# Patient Record
Sex: Male | Born: 1975
Health system: Southern US, Community
[De-identification: ages and names within clinical notes are randomized; demographics above are authoritative.]

## PROBLEM LIST (undated history)

## (undated) DIAGNOSIS — I10 Essential (primary) hypertension: Secondary | ICD-10-CM

## (undated) DIAGNOSIS — N529 Male erectile dysfunction, unspecified: Secondary | ICD-10-CM

## (undated) DIAGNOSIS — K219 Gastro-esophageal reflux disease without esophagitis: Secondary | ICD-10-CM

## (undated) DIAGNOSIS — E785 Hyperlipidemia, unspecified: Secondary | ICD-10-CM

## (undated) HISTORY — DX: Hyperlipidemia, unspecified: E78.5

## (undated) HISTORY — DX: Gastro-esophageal reflux disease without esophagitis: K21.9

## (undated) HISTORY — DX: Male erectile dysfunction, unspecified: N52.9

## (undated) HISTORY — DX: Essential (primary) hypertension: I10

---

## 2010-02-26 ENCOUNTER — Emergency Department: Payer: Self-pay | Admitting: Emergency Medicine

## 2010-03-01 ENCOUNTER — Emergency Department: Payer: Self-pay | Admitting: Emergency Medicine

## 2010-03-05 ENCOUNTER — Emergency Department: Payer: Self-pay | Admitting: Unknown Physician Specialty

## 2010-03-13 ENCOUNTER — Emergency Department: Payer: Self-pay | Admitting: Emergency Medicine

## 2010-03-27 ENCOUNTER — Emergency Department: Payer: Self-pay | Admitting: Emergency Medicine

## 2010-04-17 ENCOUNTER — Ambulatory Visit: Payer: Self-pay | Admitting: Internal Medicine

## 2010-04-23 ENCOUNTER — Emergency Department: Payer: Self-pay | Admitting: Emergency Medicine

## 2010-04-29 ENCOUNTER — Ambulatory Visit: Payer: Self-pay | Admitting: Surgery

## 2010-05-02 ENCOUNTER — Emergency Department: Payer: Self-pay | Admitting: Emergency Medicine

## 2010-05-05 ENCOUNTER — Ambulatory Visit: Payer: Self-pay | Admitting: Family Medicine

## 2010-05-19 ENCOUNTER — Ambulatory Visit: Payer: Self-pay | Admitting: Gastroenterology

## 2010-05-26 ENCOUNTER — Ambulatory Visit: Payer: Self-pay | Admitting: Surgery

## 2010-06-03 ENCOUNTER — Ambulatory Visit: Payer: Self-pay | Admitting: Specialist

## 2010-07-25 HISTORY — PX: GALLBLADDER SURGERY: SHX652

## 2010-07-25 HISTORY — PX: UPPER GASTROINTESTINAL ENDOSCOPY: SHX188

## 2010-07-25 HISTORY — PX: COLONOSCOPY: SHX174

## 2010-08-04 ENCOUNTER — Ambulatory Visit: Payer: Self-pay | Admitting: Family Medicine

## 2010-10-22 ENCOUNTER — Ambulatory Visit: Payer: Self-pay | Admitting: Neurosurgery

## 2010-11-02 ENCOUNTER — Ambulatory Visit: Payer: Self-pay | Admitting: Gastroenterology

## 2010-11-04 LAB — PATHOLOGY REPORT

## 2010-12-03 ENCOUNTER — Ambulatory Visit: Payer: Self-pay | Admitting: Surgery

## 2010-12-06 LAB — PATHOLOGY REPORT

## 2011-01-04 ENCOUNTER — Ambulatory Visit: Payer: Self-pay | Admitting: Cardiovascular Disease

## 2011-01-20 ENCOUNTER — Encounter: Payer: Self-pay | Admitting: Cardiovascular Disease

## 2011-01-20 ENCOUNTER — Ambulatory Visit (INDEPENDENT_AMBULATORY_CARE_PROVIDER_SITE_OTHER): Payer: No Typology Code available for payment source | Admitting: Cardiovascular Disease

## 2011-01-20 DIAGNOSIS — E785 Hyperlipidemia, unspecified: Secondary | ICD-10-CM | POA: Insufficient documentation

## 2011-01-20 DIAGNOSIS — R0602 Shortness of breath: Secondary | ICD-10-CM

## 2011-01-20 DIAGNOSIS — I10 Essential (primary) hypertension: Secondary | ICD-10-CM

## 2011-01-20 NOTE — Patient Instructions (Signed)
You are doing well. No medication changes were made.  Please call us if you have new issues that need to be addressed before your next appt.    

## 2011-01-20 NOTE — Assessment & Plan Note (Signed)
He is tolerating his cholesterol medication well. I encouraged him to stay on this. We'll try to obtain his most recent lipid panel from Dr. Suzie Portela.

## 2011-01-20 NOTE — Assessment & Plan Note (Signed)
Risperdal on time discussing his blood pressure with him. In general he has relatively well-controlled blood pressure. The nurses today will check his blood pressure with a manual cuff to ensure that it is consistent. I am concerned more with periods of hypotension and dizziness. He does have record of his blood pressure and her numerous blood pressures with systolic pressures less than 110. I suggested that if he continues to have periods of dizziness, that he cut his medication in half. He could even take half a pill in the morning and half at nighttime. As he works in [employer], it'll be important for him to stay hydrated.

## 2011-01-20 NOTE — Progress Notes (Signed)
   Patient ID: Dustin Perkins, male    DOB: 1975-09-15, 35 y.o.   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  Overall he has been feeling well. He does have very occasional shortness of breath though in general is able to work a full day without any symptoms. He's concerned about his blood pressure is sometimes his systolic pressures are elevated in the 140s. Occasional diastolic pressure in the 90s. He does have occasional dizziness at work particularly if he stands up too quickly. He was previously well managed on Benicar 20 mg daily this was increased to 40 mg for hypertension when systolic pressures were in the 140-150 range.  Previous echocardiogram November of 2011 was essentially normal Previous EKGs have been essentially normal  EKG today shows normal sinus rhythm with rate 82 beats per minute with no significant ST or T wave changes No lipid panel is available for [place] today     Review of Systems  Constitutional: Negative.   HENT: Negative.   Eyes: Negative.   Respiratory: Positive for shortness of breath.   Cardiovascular: Negative.   Gastrointestinal: Negative.   Musculoskeletal: Negative.   Skin: Negative.   Neurological: Negative.   Hematological: Negative.   Psychiatric/Behavioral: Negative.   All other systems reviewed and are negative.    BP 141/93  Pulse 82  Ht 5\' 11"  (1.803 m)  Wt 204 lb (92.534 kg)  BMI 28.45 kg/m2  Physical Exam  Nursing note and vitals reviewed. Constitutional: He is oriented to person, place, and time. He appears well-developed and well-nourished.  HENT:  Head: Normocephalic.  Nose: Nose normal.  Mouth/Throat: Oropharynx is clear and moist.  Eyes: Conjunctivae are normal. Pupils are equal, round, and  reactive to light.  Neck: Normal range of motion. Neck supple. No JVD present.  Cardiovascular: Normal rate, regular rhythm, S1 normal, S2 normal, normal heart sounds and intact distal pulses.  Exam reveals no gallop and no friction rub.   No murmur heard. Pulmonary/Chest: Effort normal and breath sounds normal. No respiratory distress. He has no wheezes. He has no rales. He exhibits no tenderness.  Abdominal: Soft. Bowel sounds are normal. He exhibits no distension. There is no tenderness.  Musculoskeletal: Normal range of motion. He exhibits no edema and no tenderness.  Lymphadenopathy:    He has no cervical adenopathy.  Neurological: He is alert and oriented to person, place, and time. Coordination normal.  Skin: Skin is warm and dry. No rash noted. No erythema.  Psychiatric: He has a normal mood and affect. His behavior is normal. Judgment and thought content normal.           Assessment and Plan

## 2011-01-20 NOTE — Progress Notes (Signed)
Addended by: Phoebe Sharps on: 01/20/2011 07:44 PM   Modules accepted: Level of Service

## 2011-01-20 NOTE — Assessment & Plan Note (Signed)
His shortness of breath is very atypical. 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 Able to work around the garden but no symptoms. Symptoms of shortness of breath, and rarely. Normal recent echocardiogram. Normal EKG. No further workup needed.

## 2011-01-28 ENCOUNTER — Encounter: Payer: Self-pay | Admitting: Cardiovascular Disease

## 2011-05-16 ENCOUNTER — Ambulatory Visit: Payer: Self-pay | Admitting: Gastroenterology

## 2011-09-08 IMAGING — CR DG CHEST 2V
1 series · 2 of 2 positions shown · non-contrast
Comparison: none

REASON FOR EXAM: chest pain, fatigue
COMMENTS:

[Series 1: view not recorded · 0.17mm/px · 2 of 2 slices shown]
[im 1/2]
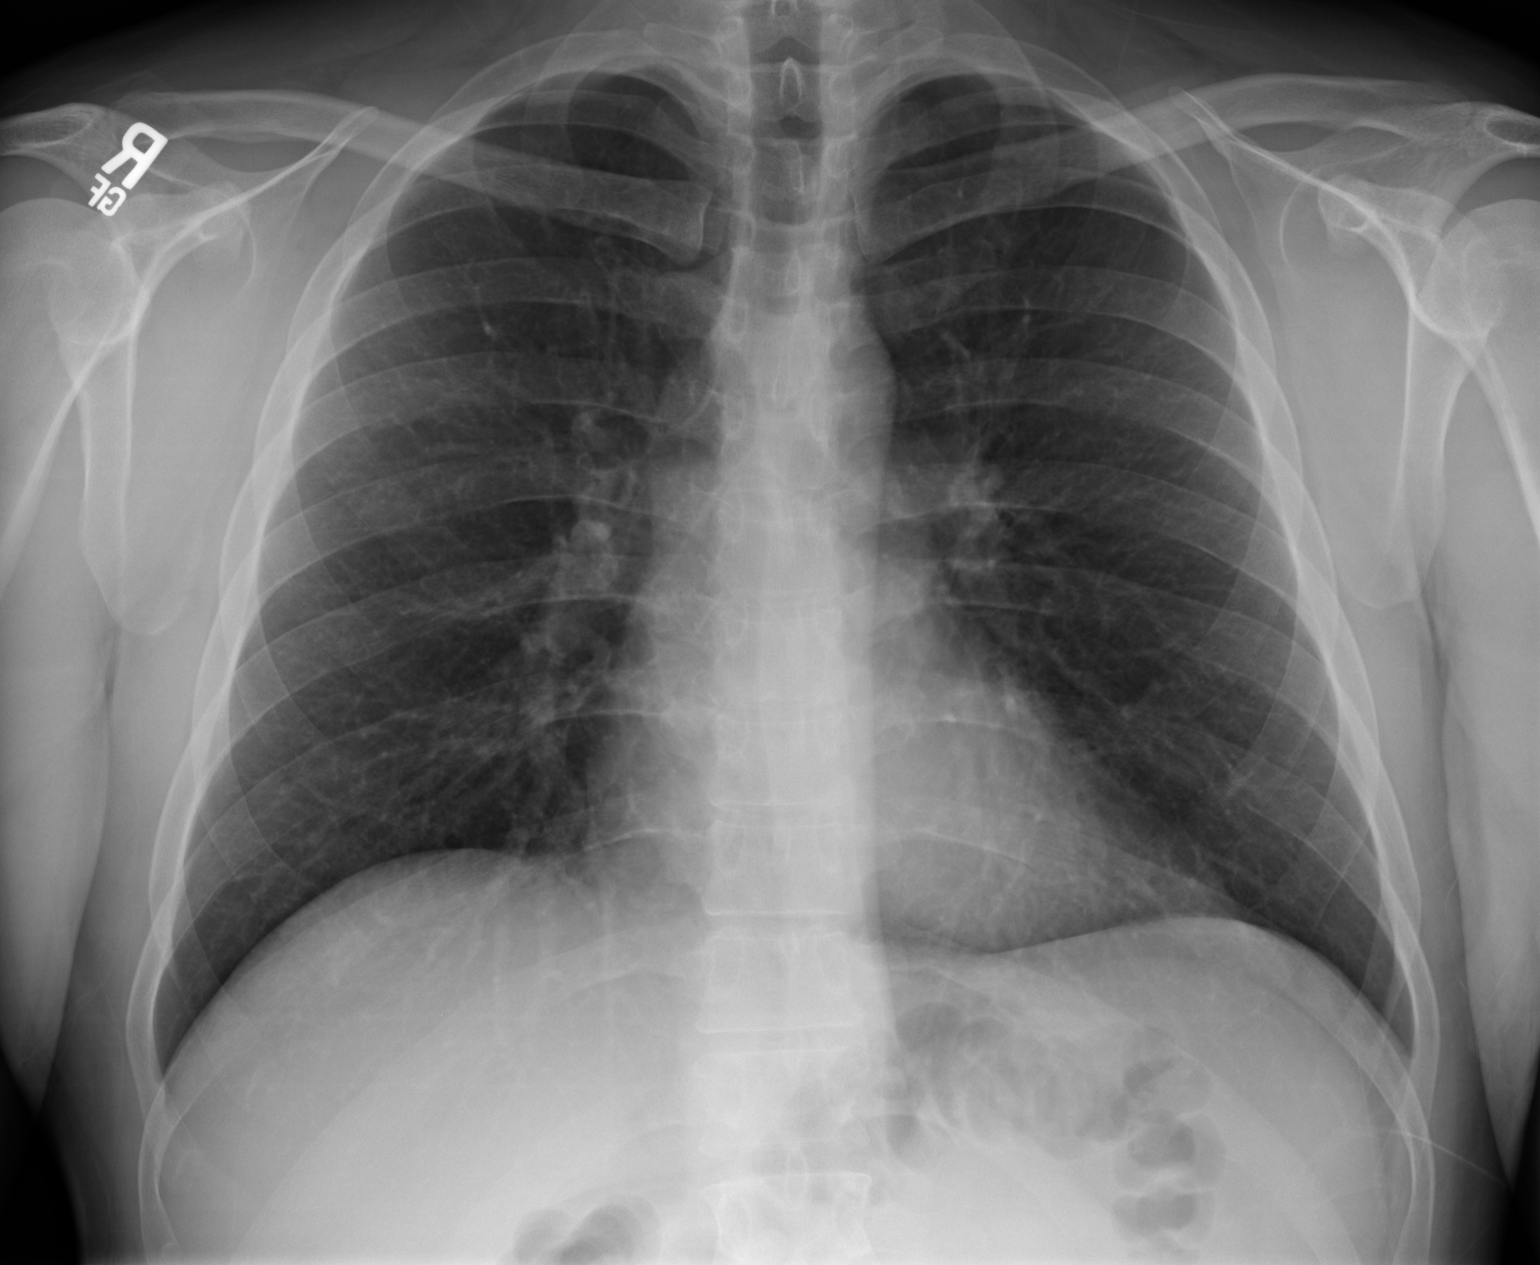
[im 2/2]
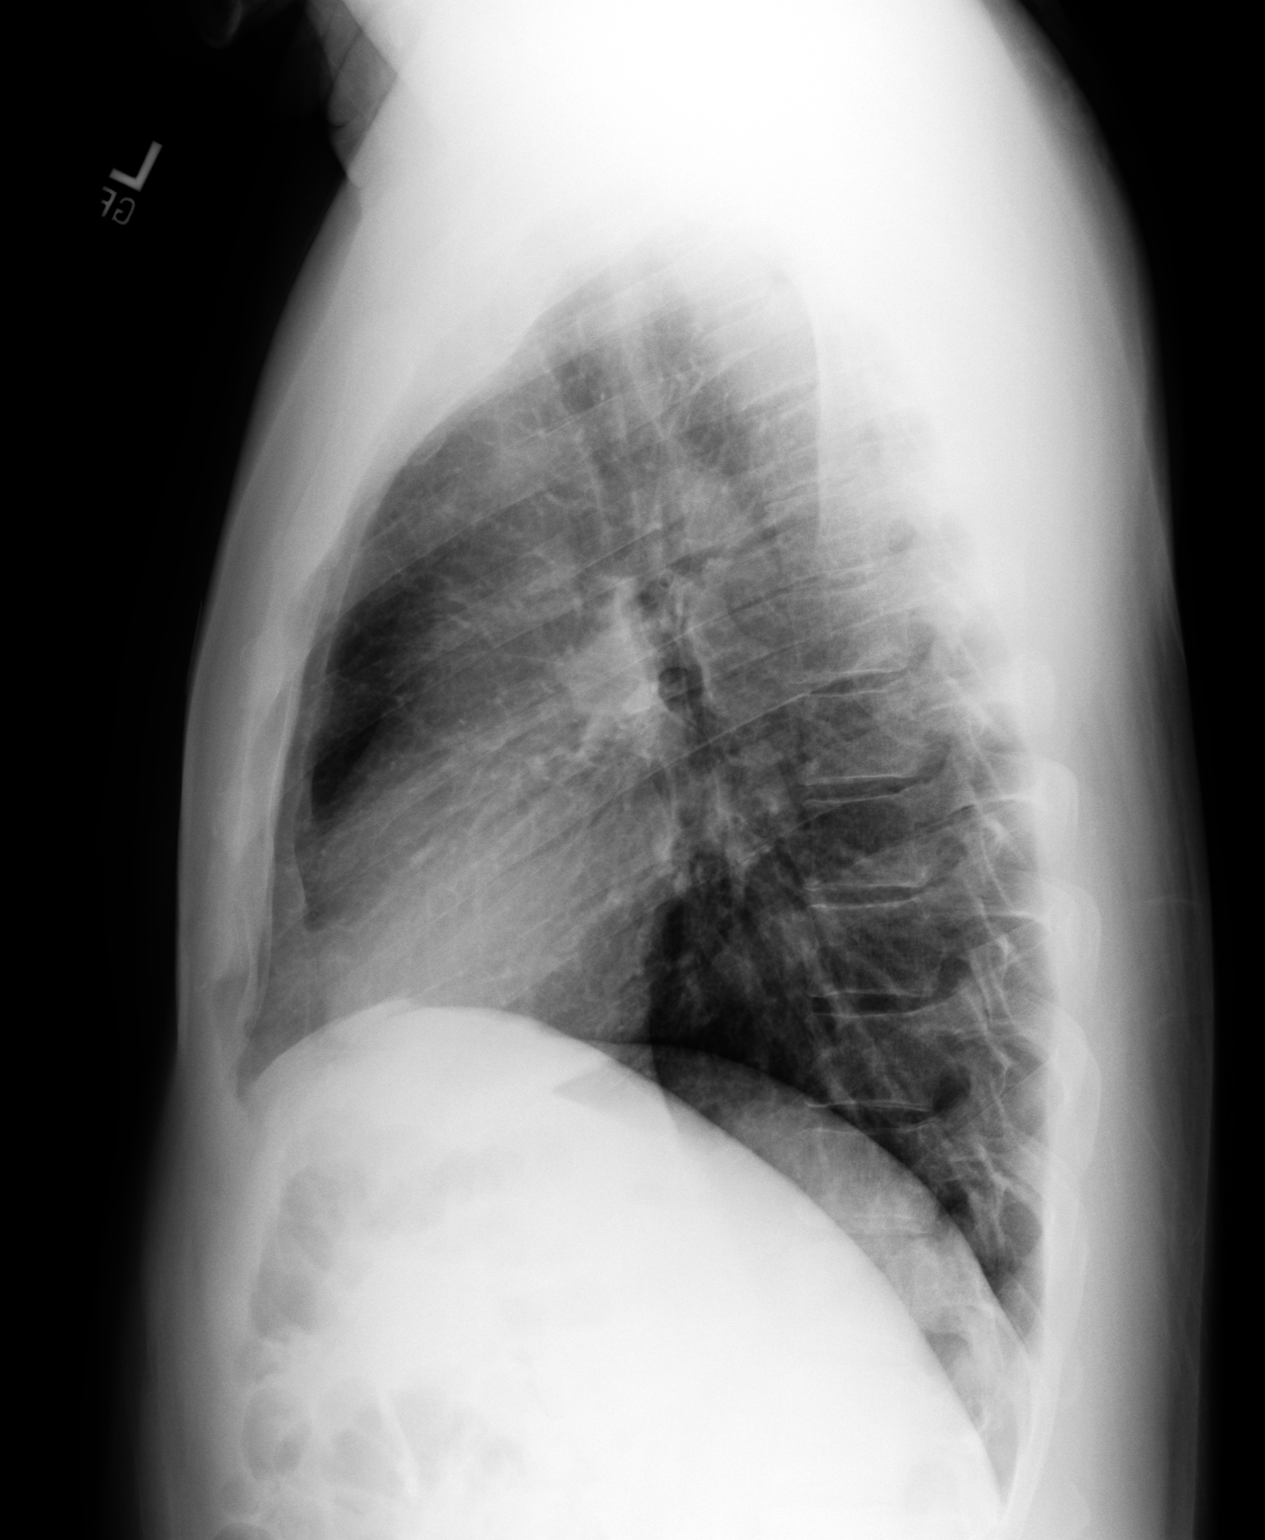

[2 of 2 positions shown; findings below may reference images not displayed]

PROCEDURE:     DXR - DXR CHEST PA (OR AP) AND LATERAL  - May 02, 2010  [DATE]

RESULT:     PA and lateral chest films reveal the lungs to be adequately
inflated. The interstitial markings are minimally increased. The cardiac
silhouette is normal in size. I see no pleural effusion. The mediastinum is
not widened. The bony thorax is grossly normal.
IMPRESSION: There are mildly increased interstitial markings in both
lungs. This may reflect mild interstitial edema of cardiac or noncardiac
cause. Followup films following therapy or in 2 to 3 weeks are recommended
to assure clearing.

## 2011-09-11 IMAGING — CR ORBITS FOR FOREIGN BODY - 2 VIEW
1 series · 3 of 3 positions shown · non-contrast
Comparison: none

REASON FOR EXAM: Hx of metal in eye. Eval for residual fragments prior to
MRI
COMMENTS:

PROCEDURE:     DXR - DXR ORBITS FOR MRI CLEARANCE  - May 05, 2010  [DATE]
RESULT:     Three views of the orbits are submitted. I do not see evidence
of retained metallic foreign bodies. The bony structures are grossly normal.

[Series 1: view not recorded · 0.17mm/px · 3 of 3 slices shown]
[im 1/3]
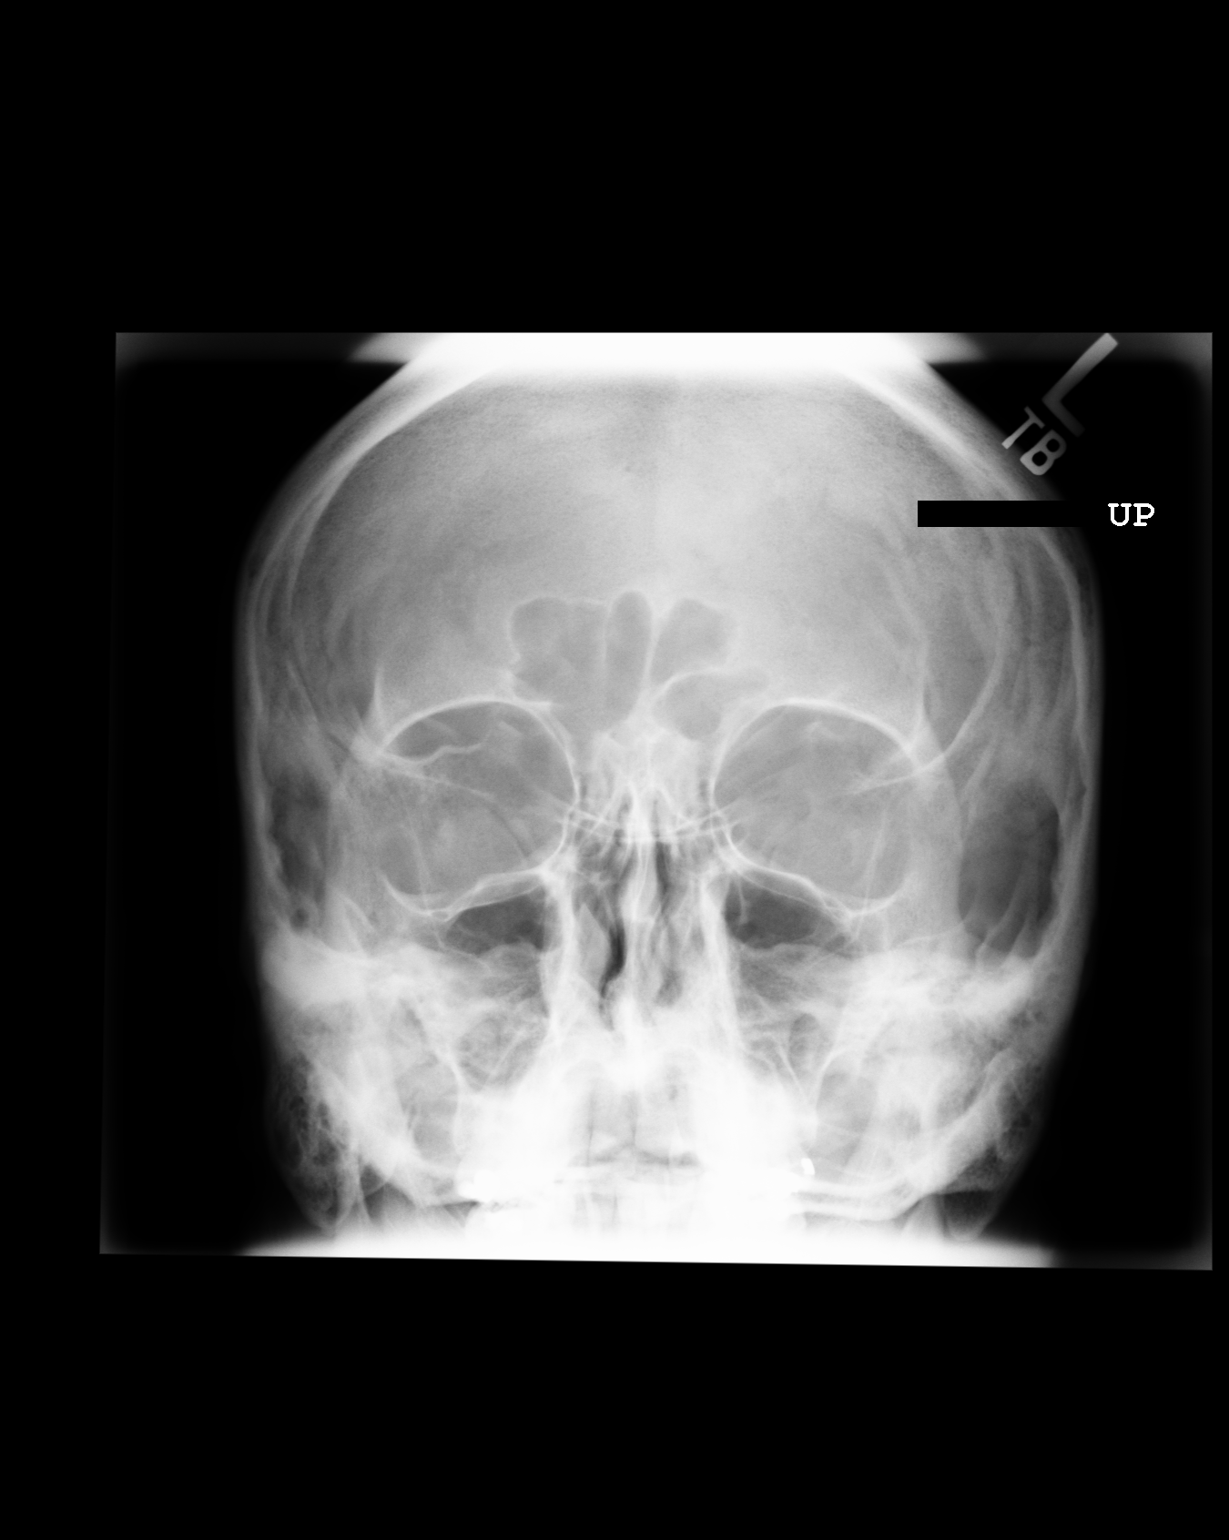
[im 2/3]
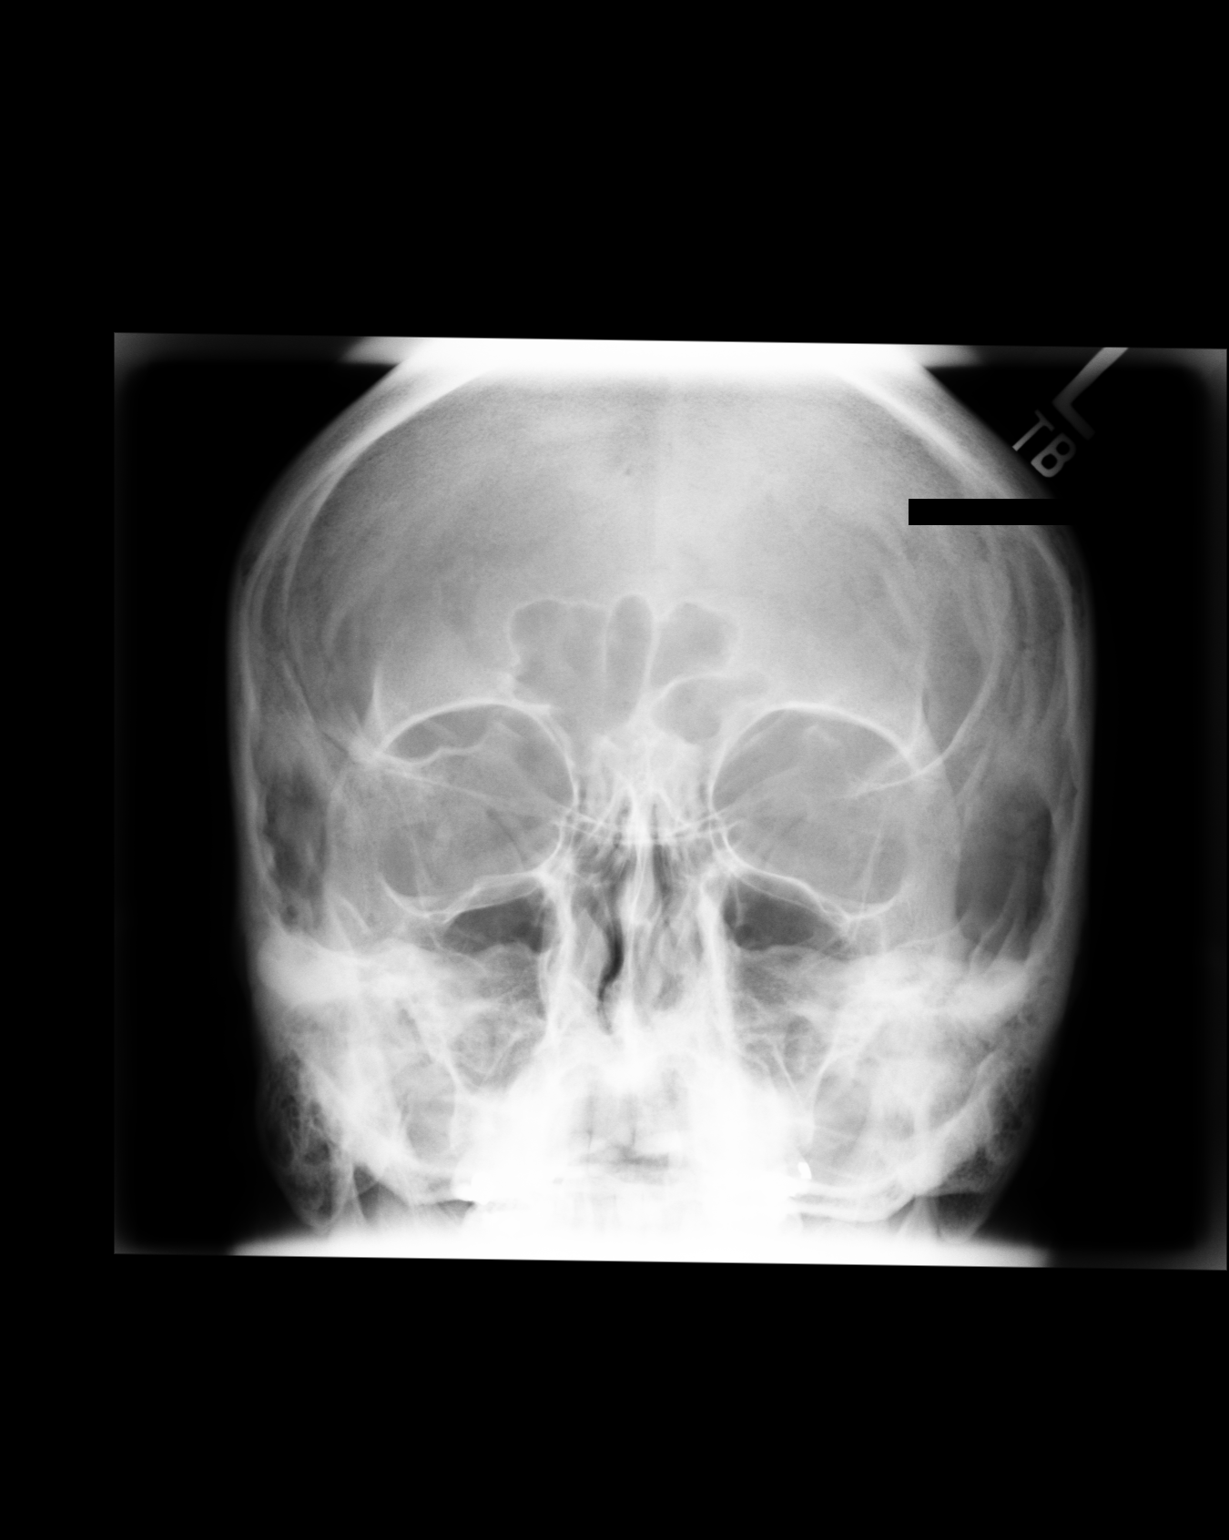
[im 3/3]
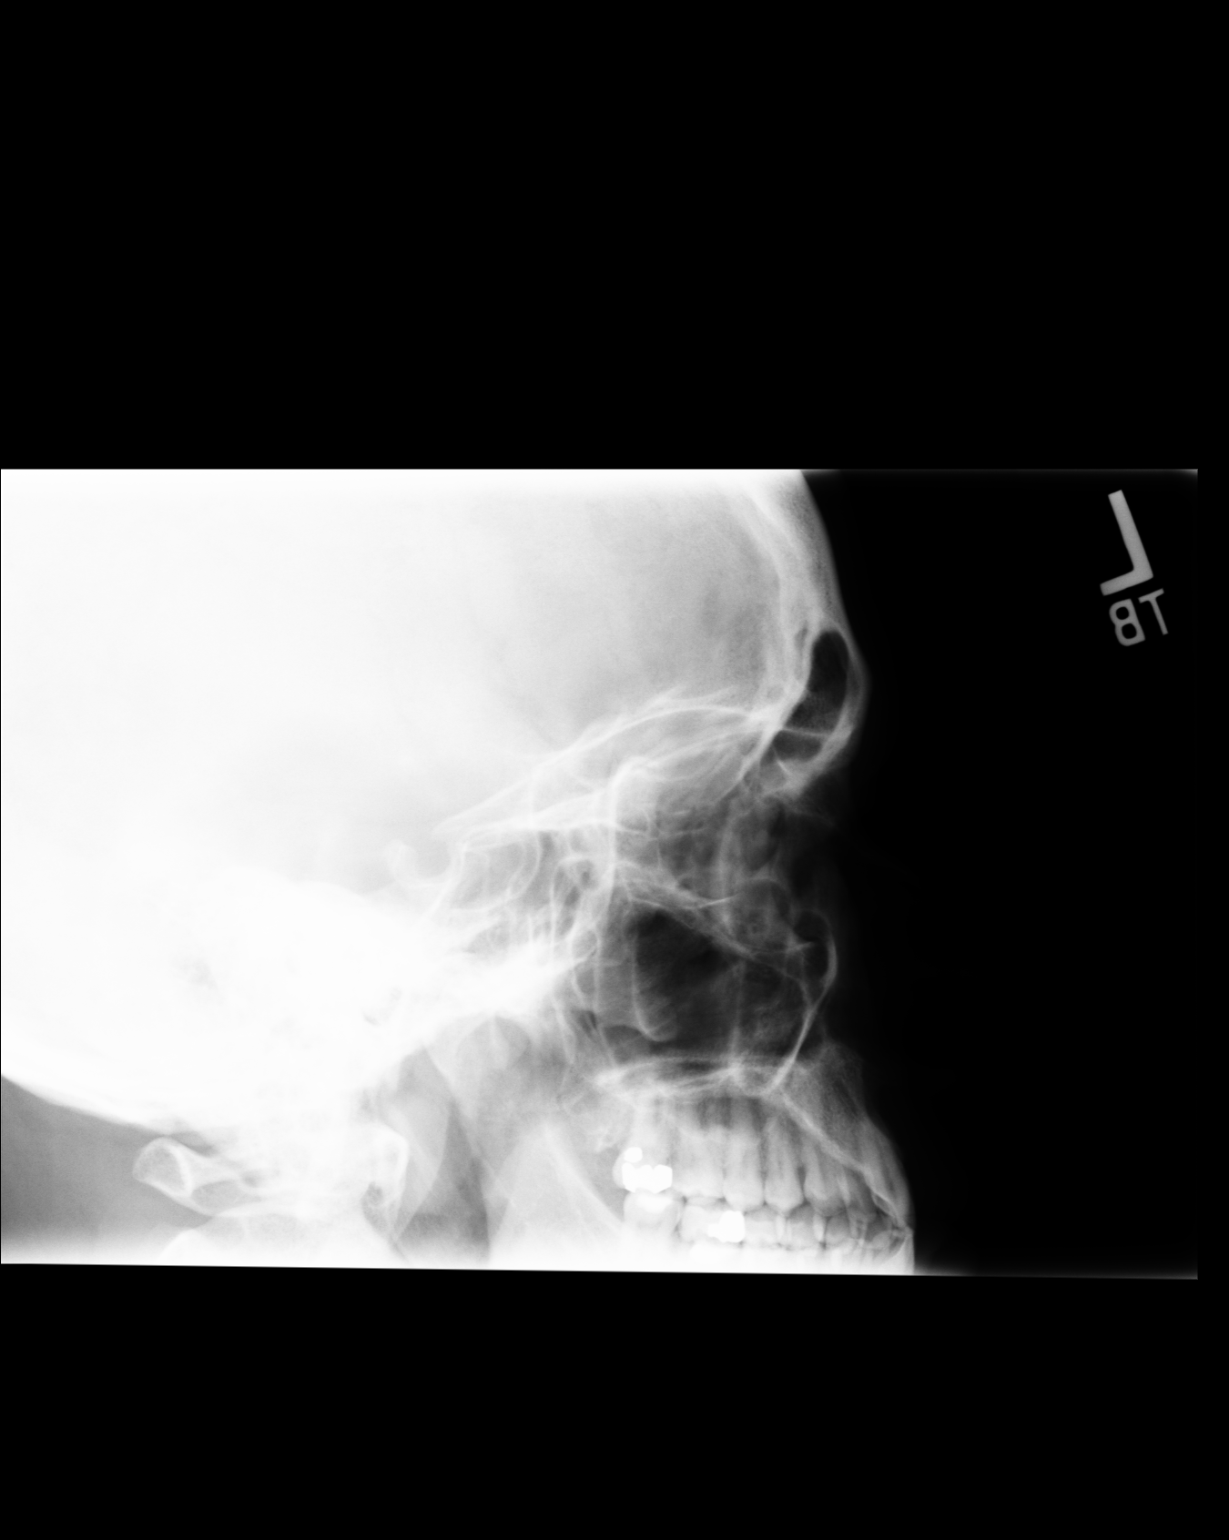

[3 of 3 positions shown; findings below may reference images not displayed]

IMPRESSION: I see no retained metallic foreign bodies over the orbits.
I see no contraindication to MRI.

## 2011-11-18 ENCOUNTER — Ambulatory Visit: Payer: Self-pay | Admitting: Neurosurgery

## 2011-11-18 LAB — CREATININE, SERUM
Creatinine: 1.16 mg/dL (ref 0.60–1.30)
EGFR (Non-African Amer.): >60

## 2012-02-15 ENCOUNTER — Ambulatory Visit: Payer: Self-pay | Admitting: Gastroenterology

## 2012-02-24 ENCOUNTER — Ambulatory Visit: Payer: Self-pay | Admitting: Gastroenterology

## 2012-02-28 LAB — PATHOLOGY REPORT

## 2012-04-04 ENCOUNTER — Ambulatory Visit: Payer: Self-pay | Admitting: Neurology

## 2012-06-15 ENCOUNTER — Ambulatory Visit: Payer: Self-pay

## 2012-09-18 ENCOUNTER — Ambulatory Visit: Payer: Self-pay | Admitting: Neurology

## 2012-12-10 ENCOUNTER — Encounter: Payer: Self-pay | Admitting: Cardiovascular Disease

## 2012-12-10 ENCOUNTER — Ambulatory Visit (INDEPENDENT_AMBULATORY_CARE_PROVIDER_SITE_OTHER): Payer: 59 | Admitting: Cardiovascular Disease

## 2012-12-10 VITALS — BP 130/80 | HR 86 | Wt 198.0 lb

## 2012-12-10 DIAGNOSIS — I10 Essential (primary) hypertension: Secondary | ICD-10-CM

## 2012-12-10 DIAGNOSIS — E785 Hyperlipidemia, unspecified: Secondary | ICD-10-CM

## 2012-12-10 NOTE — Assessment & Plan Note (Signed)
Blood pressure well controlled on low-dose losartan. No need for additional medications or workup. If he has worsening orthostatic type symptoms, dose of losartan can be decreased. Encouraged him to stay hydrated. He did mention the possible need for a tilt table test. This is likely not needed given no history of syncope. His lightheadedness is consistent with orthostasis, positional in nature.

## 2012-12-10 NOTE — Patient Instructions (Addendum)
You are doing well. No medication changes were made.  Please call us if you have new issues that need to be addressed before your next appt.    

## 2012-12-10 NOTE — Assessment & Plan Note (Signed)
Will defer to Dr. Graciela Husbands. Crestor recently held given multitude of symptoms.

## 2012-12-10 NOTE — Progress Notes (Signed)
Patient ID: Dustin Perkins, male    DOB: 1976-05-19, 37 y.o.   MRN: [id number]  HPI Comments: 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 Previous evaluation in 2012 for hypertension and hyperlipidemia.  Overall he has been feeling well. He had low blood pressures on Benicar and. Does not take Benicar and is taking losartan. Does report having lightheaded spells when he goes from a squatting position to standing position, also sometimes after bending over and then standing up. He seems to happen particularly on the job when working in [employer], moving heavy pieces of wood. He has stopped his Crestor under the direction of Dr. Graciela Husbands. He has been having significant GI issues, anxiety issues. Chronic right upper quadrant pain issues. He reports that he's been to [facility] been seen GI and was told that he might have transverse colon motility issues. He has had EGDs, esophageal stretching, currently taking dexilant twice a day. No significant improvement of his symptoms. Pain in his right upper quadrant is a dull ache at times. Recently started on SSRIs.  He reports having routine treadmill study at [facility] October 2013 which was normal. Good exercise tolerance. Reports not available to [place] today  Previous echocardiogram November of 2011 was essentially normal Previous EKGs have been essentially normal  EKG today shows normal sinus rhythm with rate 86 beats per minute with no significant ST or T wave changes No lipid panel is available for [place] today   Outpatient Encounter Prescriptions as of 12/10/2012  Medication Sig Dispense Refill  . clonazePAM (KLONOPIN) 0.5 MG tablet Take 0.5 mg by mouth 2 (two) times daily.      Marland Kitchen dexlansoprazole (DEXILANT) 60 MG capsule Take 60 mg by mouth daily.      . DULoxetine (CYMBALTA) 30 MG capsule Take  30 mg by mouth daily.      Marland Kitchen losartan (COZAAR) 50 MG tablet Take 50 mg by mouth daily.      . mirtazapine (REMERON) 7.5 MG tablet Take 7.5 mg by mouth at bedtime.         Review of Systems  Constitutional: Negative.   HENT: Negative.   Eyes: Negative.   Cardiovascular: Negative.   Gastrointestinal: Positive for abdominal pain.  Musculoskeletal: Negative.   Skin: Negative.   Neurological: Negative.   Psychiatric/Behavioral: The patient is nervous/anxious.   All other systems reviewed and are negative.    BP 130/80  Pulse 86  Wt 198 lb (89.812 kg)  BMI 27.63 kg/m2  Physical Exam  Nursing note and vitals reviewed. Constitutional: He is oriented to person, place, and time. He appears well-developed and well-nourished.  HENT:  Head: Normocephalic.  Nose: Nose normal.  Mouth/Throat: Oropharynx is clear and moist.  Eyes: Conjunctivae are normal. Pupils are equal, round, and reactive to light.  Neck: Normal range of motion. Neck supple. No JVD present.  Cardiovascular: Normal rate, regular rhythm, S1 normal, S2 normal, normal heart sounds and intact distal pulses.  Exam reveals no gallop and no friction rub.   No murmur heard. Pulmonary/Chest: Effort normal and breath sounds normal. No respiratory distress. He has no wheezes. He has no rales. He exhibits no tenderness.  Abdominal: Soft. Bowel sounds are normal. He exhibits no distension. There is no tenderness.  Musculoskeletal: Normal range of motion. He exhibits no edema and no tenderness.  Lymphadenopathy:    He  has no cervical adenopathy.  Neurological: He is alert and oriented to person, place, and time. Coordination normal.  Skin: Skin is warm and dry. No rash noted. No erythema.  Psychiatric: He has a normal mood and affect. His behavior is normal. Judgment and thought content normal.      Assessment and Plan

## 2013-01-02 ENCOUNTER — Telehealth: Payer: Self-pay

## 2013-01-02 NOTE — Telephone Encounter (Signed)
Request from Disability Determination Services , sent to [employer] on 01/02/2013.  

## 2013-03-19 ENCOUNTER — Encounter: Payer: Self-pay | Admitting: Internal Medicine

## 2013-03-25 ENCOUNTER — Encounter: Payer: Self-pay | Admitting: Internal Medicine

## 2013-04-24 ENCOUNTER — Encounter: Payer: Self-pay | Admitting: Internal Medicine

## 2015-07-29 DIAGNOSIS — D1802 Hemangioma of intracranial structures: Secondary | ICD-10-CM | POA: Diagnosis not present

## 2015-08-05 DIAGNOSIS — E1165 Type 2 diabetes mellitus with hyperglycemia: Secondary | ICD-10-CM | POA: Diagnosis not present

## 2015-08-05 DIAGNOSIS — I10 Essential (primary) hypertension: Secondary | ICD-10-CM | POA: Diagnosis not present

## 2015-08-11 DIAGNOSIS — Z872 Personal history of diseases of the skin and subcutaneous tissue: Secondary | ICD-10-CM | POA: Diagnosis not present

## 2015-08-11 DIAGNOSIS — L905 Scar conditions and fibrosis of skin: Secondary | ICD-10-CM | POA: Diagnosis not present

## 2015-08-11 DIAGNOSIS — L538 Other specified erythematous conditions: Secondary | ICD-10-CM | POA: Diagnosis not present

## 2015-08-11 DIAGNOSIS — L281 Prurigo nodularis: Secondary | ICD-10-CM | POA: Diagnosis not present

## 2015-08-11 DIAGNOSIS — Z1283 Encounter for screening for malignant neoplasm of skin: Secondary | ICD-10-CM | POA: Diagnosis not present

## 2015-08-11 DIAGNOSIS — L728 Other follicular cysts of the skin and subcutaneous tissue: Secondary | ICD-10-CM | POA: Diagnosis not present

## 2015-08-11 DIAGNOSIS — R238 Other skin changes: Secondary | ICD-10-CM | POA: Diagnosis not present

## 2015-08-11 DIAGNOSIS — L821 Other seborrheic keratosis: Secondary | ICD-10-CM | POA: Diagnosis not present

## 2015-08-11 DIAGNOSIS — Z789 Other specified health status: Secondary | ICD-10-CM | POA: Diagnosis not present

## 2015-08-12 DIAGNOSIS — E1165 Type 2 diabetes mellitus with hyperglycemia: Secondary | ICD-10-CM | POA: Diagnosis not present

## 2015-08-12 DIAGNOSIS — F418 Other specified anxiety disorders: Secondary | ICD-10-CM | POA: Diagnosis not present

## 2015-08-12 DIAGNOSIS — I10 Essential (primary) hypertension: Secondary | ICD-10-CM | POA: Diagnosis not present

## 2015-08-12 DIAGNOSIS — K219 Gastro-esophageal reflux disease without esophagitis: Secondary | ICD-10-CM | POA: Diagnosis not present

## 2015-08-13 DIAGNOSIS — D1801 Hemangioma of skin and subcutaneous tissue: Secondary | ICD-10-CM | POA: Diagnosis not present

## 2015-08-14 DIAGNOSIS — F320 Major depressive disorder, single episode, mild: Secondary | ICD-10-CM | POA: Diagnosis not present

## 2015-08-14 DIAGNOSIS — F410 Panic disorder [episodic paroxysmal anxiety] without agoraphobia: Secondary | ICD-10-CM | POA: Diagnosis not present

## 2015-09-21 DIAGNOSIS — F320 Major depressive disorder, single episode, mild: Secondary | ICD-10-CM | POA: Diagnosis not present

## 2015-09-21 DIAGNOSIS — F410 Panic disorder [episodic paroxysmal anxiety] without agoraphobia: Secondary | ICD-10-CM | POA: Diagnosis not present

## 2015-11-12 DIAGNOSIS — D1801 Hemangioma of skin and subcutaneous tissue: Secondary | ICD-10-CM | POA: Diagnosis not present

## 2015-11-12 DIAGNOSIS — E1165 Type 2 diabetes mellitus with hyperglycemia: Secondary | ICD-10-CM | POA: Diagnosis not present

## 2015-11-12 DIAGNOSIS — G8929 Other chronic pain: Secondary | ICD-10-CM | POA: Diagnosis not present

## 2015-11-12 DIAGNOSIS — R6882 Decreased libido: Secondary | ICD-10-CM | POA: Diagnosis not present

## 2015-11-12 DIAGNOSIS — R1011 Right upper quadrant pain: Secondary | ICD-10-CM | POA: Diagnosis not present

## 2015-11-19 DIAGNOSIS — I10 Essential (primary) hypertension: Secondary | ICD-10-CM | POA: Diagnosis not present

## 2015-11-19 DIAGNOSIS — R1011 Right upper quadrant pain: Secondary | ICD-10-CM | POA: Diagnosis not present

## 2015-11-19 DIAGNOSIS — E784 Other hyperlipidemia: Secondary | ICD-10-CM | POA: Diagnosis not present

## 2015-11-19 DIAGNOSIS — K219 Gastro-esophageal reflux disease without esophagitis: Secondary | ICD-10-CM | POA: Diagnosis not present

## 2015-11-20 DIAGNOSIS — F410 Panic disorder [episodic paroxysmal anxiety] without agoraphobia: Secondary | ICD-10-CM | POA: Diagnosis not present

## 2015-11-20 DIAGNOSIS — F320 Major depressive disorder, single episode, mild: Secondary | ICD-10-CM | POA: Diagnosis not present

## 2015-11-23 DIAGNOSIS — Z86010 Personal history of colonic polyps: Secondary | ICD-10-CM | POA: Diagnosis not present

## 2015-11-23 DIAGNOSIS — R7309 Other abnormal glucose: Secondary | ICD-10-CM | POA: Diagnosis not present

## 2015-11-23 DIAGNOSIS — K648 Other hemorrhoids: Secondary | ICD-10-CM | POA: Diagnosis not present

## 2015-11-23 DIAGNOSIS — D125 Benign neoplasm of sigmoid colon: Secondary | ICD-10-CM | POA: Diagnosis not present

## 2015-11-23 DIAGNOSIS — D120 Benign neoplasm of cecum: Secondary | ICD-10-CM | POA: Diagnosis not present

## 2015-11-23 DIAGNOSIS — E119 Type 2 diabetes mellitus without complications: Secondary | ICD-10-CM | POA: Diagnosis not present

## 2015-11-23 DIAGNOSIS — Z1211 Encounter for screening for malignant neoplasm of colon: Secondary | ICD-10-CM | POA: Diagnosis not present

## 2015-11-23 DIAGNOSIS — D127 Benign neoplasm of rectosigmoid junction: Secondary | ICD-10-CM | POA: Diagnosis not present

## 2015-11-23 DIAGNOSIS — D369 Benign neoplasm, unspecified site: Secondary | ICD-10-CM | POA: Diagnosis not present

## 2015-11-23 DIAGNOSIS — D124 Benign neoplasm of descending colon: Secondary | ICD-10-CM | POA: Diagnosis not present

## 2015-11-23 DIAGNOSIS — D123 Benign neoplasm of transverse colon: Secondary | ICD-10-CM | POA: Diagnosis not present

## 2015-11-23 DIAGNOSIS — I10 Essential (primary) hypertension: Secondary | ICD-10-CM | POA: Diagnosis not present

## 2015-12-17 DIAGNOSIS — E291 Testicular hypofunction: Secondary | ICD-10-CM | POA: Diagnosis not present

## 2016-02-17 DIAGNOSIS — E1165 Type 2 diabetes mellitus with hyperglycemia: Secondary | ICD-10-CM | POA: Diagnosis not present

## 2016-02-17 DIAGNOSIS — E291 Testicular hypofunction: Secondary | ICD-10-CM | POA: Diagnosis not present

## 2016-02-17 DIAGNOSIS — R1011 Right upper quadrant pain: Secondary | ICD-10-CM | POA: Diagnosis not present

## 2016-02-17 DIAGNOSIS — Z125 Encounter for screening for malignant neoplasm of prostate: Secondary | ICD-10-CM | POA: Diagnosis not present

## 2016-02-17 DIAGNOSIS — I10 Essential (primary) hypertension: Secondary | ICD-10-CM | POA: Diagnosis not present

## 2016-02-17 DIAGNOSIS — G8929 Other chronic pain: Secondary | ICD-10-CM | POA: Diagnosis not present

## 2016-02-18 ENCOUNTER — Telehealth: Payer: Self-pay | Admitting: Cardiovascular Disease

## 2016-02-18 NOTE — Telephone Encounter (Signed)
.  himroi

## 2016-02-19 ENCOUNTER — Telehealth: Payer: Self-pay | Admitting: Cardiovascular Disease

## 2016-02-19 NOTE — Telephone Encounter (Signed)
Received records request [name], [name], [place] PA, forwarded to [facility] for processing.  Faxed to [place] - Informed and/or LMOV to patient regarding release of information packet.  - Pt wife, Marlowe Kays, came by the office and picked  release packet.

## 2016-02-24 DIAGNOSIS — R1011 Right upper quadrant pain: Secondary | ICD-10-CM | POA: Diagnosis not present

## 2016-02-24 DIAGNOSIS — Z23 Encounter for immunization: Secondary | ICD-10-CM | POA: Diagnosis not present

## 2016-02-24 DIAGNOSIS — E1165 Type 2 diabetes mellitus with hyperglycemia: Secondary | ICD-10-CM | POA: Diagnosis not present

## 2016-02-24 DIAGNOSIS — F418 Other specified anxiety disorders: Secondary | ICD-10-CM | POA: Diagnosis not present

## 2016-02-24 DIAGNOSIS — K219 Gastro-esophageal reflux disease without esophagitis: Secondary | ICD-10-CM | POA: Diagnosis not present

## 2016-02-26 DIAGNOSIS — F410 Panic disorder [episodic paroxysmal anxiety] without agoraphobia: Secondary | ICD-10-CM | POA: Diagnosis not present

## 2016-02-26 DIAGNOSIS — F320 Major depressive disorder, single episode, mild: Secondary | ICD-10-CM | POA: Diagnosis not present

## 2016-05-19 DIAGNOSIS — I10 Essential (primary) hypertension: Secondary | ICD-10-CM | POA: Diagnosis not present

## 2016-05-19 DIAGNOSIS — E1165 Type 2 diabetes mellitus with hyperglycemia: Secondary | ICD-10-CM | POA: Diagnosis not present

## 2016-05-19 DIAGNOSIS — E349 Endocrine disorder, unspecified: Secondary | ICD-10-CM | POA: Diagnosis not present

## 2016-05-26 DIAGNOSIS — E1165 Type 2 diabetes mellitus with hyperglycemia: Secondary | ICD-10-CM | POA: Diagnosis not present

## 2016-05-26 DIAGNOSIS — Z23 Encounter for immunization: Secondary | ICD-10-CM | POA: Diagnosis not present

## 2016-05-27 DIAGNOSIS — F410 Panic disorder [episodic paroxysmal anxiety] without agoraphobia: Secondary | ICD-10-CM | POA: Diagnosis not present

## 2016-05-27 DIAGNOSIS — F341 Dysthymic disorder: Secondary | ICD-10-CM | POA: Diagnosis not present

## 2016-06-29 DIAGNOSIS — F410 Panic disorder [episodic paroxysmal anxiety] without agoraphobia: Secondary | ICD-10-CM | POA: Diagnosis not present

## 2016-06-29 DIAGNOSIS — F341 Dysthymic disorder: Secondary | ICD-10-CM | POA: Diagnosis not present

## 2016-08-19 DIAGNOSIS — E1165 Type 2 diabetes mellitus with hyperglycemia: Secondary | ICD-10-CM | POA: Diagnosis not present

## 2016-08-26 DIAGNOSIS — E784 Other hyperlipidemia: Secondary | ICD-10-CM | POA: Diagnosis not present

## 2016-08-26 DIAGNOSIS — I10 Essential (primary) hypertension: Secondary | ICD-10-CM | POA: Diagnosis not present

## 2016-08-26 DIAGNOSIS — E1165 Type 2 diabetes mellitus with hyperglycemia: Secondary | ICD-10-CM | POA: Diagnosis not present

## 2016-08-26 DIAGNOSIS — K219 Gastro-esophageal reflux disease without esophagitis: Secondary | ICD-10-CM | POA: Diagnosis not present

## 2016-08-29 DIAGNOSIS — F341 Dysthymic disorder: Secondary | ICD-10-CM | POA: Diagnosis not present

## 2016-08-29 DIAGNOSIS — F410 Panic disorder [episodic paroxysmal anxiety] without agoraphobia: Secondary | ICD-10-CM | POA: Diagnosis not present

## 2016-08-30 DIAGNOSIS — L719 Rosacea, unspecified: Secondary | ICD-10-CM | POA: Diagnosis not present

## 2016-08-30 DIAGNOSIS — B360 Pityriasis versicolor: Secondary | ICD-10-CM | POA: Diagnosis not present

## 2016-08-30 DIAGNOSIS — Z86018 Personal history of other benign neoplasm: Secondary | ICD-10-CM | POA: Diagnosis not present

## 2016-08-30 DIAGNOSIS — D229 Melanocytic nevi, unspecified: Secondary | ICD-10-CM | POA: Diagnosis not present

## 2016-09-16 DIAGNOSIS — E784 Other hyperlipidemia: Secondary | ICD-10-CM | POA: Diagnosis not present

## 2016-09-22 DIAGNOSIS — F341 Dysthymic disorder: Secondary | ICD-10-CM | POA: Diagnosis not present

## 2016-09-22 DIAGNOSIS — F410 Panic disorder [episodic paroxysmal anxiety] without agoraphobia: Secondary | ICD-10-CM | POA: Diagnosis not present

## 2016-09-23 DIAGNOSIS — E784 Other hyperlipidemia: Secondary | ICD-10-CM | POA: Diagnosis not present

## 2016-09-23 DIAGNOSIS — E1165 Type 2 diabetes mellitus with hyperglycemia: Secondary | ICD-10-CM | POA: Diagnosis not present

## 2016-09-23 DIAGNOSIS — K219 Gastro-esophageal reflux disease without esophagitis: Secondary | ICD-10-CM | POA: Diagnosis not present

## 2016-09-23 DIAGNOSIS — E349 Endocrine disorder, unspecified: Secondary | ICD-10-CM | POA: Diagnosis not present

## 2016-10-18 DIAGNOSIS — F410 Panic disorder [episodic paroxysmal anxiety] without agoraphobia: Secondary | ICD-10-CM | POA: Diagnosis not present

## 2016-10-18 DIAGNOSIS — F341 Dysthymic disorder: Secondary | ICD-10-CM | POA: Diagnosis not present

## 2016-12-20 DIAGNOSIS — E349 Endocrine disorder, unspecified: Secondary | ICD-10-CM | POA: Diagnosis not present

## 2016-12-20 DIAGNOSIS — E1165 Type 2 diabetes mellitus with hyperglycemia: Secondary | ICD-10-CM | POA: Diagnosis not present

## 2016-12-27 DIAGNOSIS — K219 Gastro-esophageal reflux disease without esophagitis: Secondary | ICD-10-CM | POA: Diagnosis not present

## 2016-12-27 DIAGNOSIS — D1802 Hemangioma of intracranial structures: Secondary | ICD-10-CM | POA: Diagnosis not present

## 2016-12-27 DIAGNOSIS — I10 Essential (primary) hypertension: Secondary | ICD-10-CM | POA: Diagnosis not present

## 2016-12-27 DIAGNOSIS — E1165 Type 2 diabetes mellitus with hyperglycemia: Secondary | ICD-10-CM | POA: Diagnosis not present

## 2017-02-01 DIAGNOSIS — F410 Panic disorder [episodic paroxysmal anxiety] without agoraphobia: Secondary | ICD-10-CM | POA: Diagnosis not present

## 2017-02-01 DIAGNOSIS — F341 Dysthymic disorder: Secondary | ICD-10-CM | POA: Diagnosis not present

## 2017-06-01 DIAGNOSIS — F410 Panic disorder [episodic paroxysmal anxiety] without agoraphobia: Secondary | ICD-10-CM | POA: Diagnosis not present

## 2017-06-01 DIAGNOSIS — F341 Dysthymic disorder: Secondary | ICD-10-CM | POA: Diagnosis not present

## 2017-06-29 DIAGNOSIS — Z125 Encounter for screening for malignant neoplasm of prostate: Secondary | ICD-10-CM | POA: Diagnosis not present

## 2017-06-29 DIAGNOSIS — D1802 Hemangioma of intracranial structures: Secondary | ICD-10-CM | POA: Diagnosis not present

## 2017-06-29 DIAGNOSIS — E349 Endocrine disorder, unspecified: Secondary | ICD-10-CM | POA: Diagnosis not present

## 2017-06-29 DIAGNOSIS — I10 Essential (primary) hypertension: Secondary | ICD-10-CM | POA: Diagnosis not present

## 2017-06-29 DIAGNOSIS — G8929 Other chronic pain: Secondary | ICD-10-CM | POA: Diagnosis not present

## 2017-06-29 DIAGNOSIS — E7849 Other hyperlipidemia: Secondary | ICD-10-CM | POA: Diagnosis not present

## 2017-06-29 DIAGNOSIS — E1165 Type 2 diabetes mellitus with hyperglycemia: Secondary | ICD-10-CM | POA: Diagnosis not present

## 2017-06-29 DIAGNOSIS — R1011 Right upper quadrant pain: Secondary | ICD-10-CM | POA: Diagnosis not present

## 2017-07-06 DIAGNOSIS — Z23 Encounter for immunization: Secondary | ICD-10-CM | POA: Diagnosis not present

## 2017-07-06 DIAGNOSIS — I10 Essential (primary) hypertension: Secondary | ICD-10-CM | POA: Diagnosis not present

## 2017-07-06 DIAGNOSIS — E1165 Type 2 diabetes mellitus with hyperglycemia: Secondary | ICD-10-CM | POA: Diagnosis not present

## 2017-07-06 DIAGNOSIS — K219 Gastro-esophageal reflux disease without esophagitis: Secondary | ICD-10-CM | POA: Diagnosis not present

## 2017-07-06 DIAGNOSIS — Z0000 Encounter for general adult medical examination without abnormal findings: Secondary | ICD-10-CM | POA: Diagnosis not present

## 2017-08-04 DIAGNOSIS — E119 Type 2 diabetes mellitus without complications: Secondary | ICD-10-CM | POA: Diagnosis not present

## 2017-08-22 ENCOUNTER — Ambulatory Visit: Payer: 59 | Attending: Neurology

## 2017-08-22 DIAGNOSIS — R51 Headache: Secondary | ICD-10-CM | POA: Diagnosis not present

## 2017-08-22 DIAGNOSIS — G4733 Obstructive sleep apnea (adult) (pediatric): Secondary | ICD-10-CM | POA: Diagnosis not present

## 2017-08-22 DIAGNOSIS — G478 Other sleep disorders: Secondary | ICD-10-CM | POA: Diagnosis not present

## 2017-08-22 DIAGNOSIS — R0683 Snoring: Secondary | ICD-10-CM | POA: Diagnosis not present

## 2017-08-22 DIAGNOSIS — G4710 Hypersomnia, unspecified: Secondary | ICD-10-CM | POA: Insufficient documentation

## 2017-08-22 DIAGNOSIS — G4730 Sleep apnea, unspecified: Secondary | ICD-10-CM | POA: Diagnosis not present

## 2017-09-07 DIAGNOSIS — F410 Panic disorder [episodic paroxysmal anxiety] without agoraphobia: Secondary | ICD-10-CM | POA: Diagnosis not present

## 2017-09-07 DIAGNOSIS — F341 Dysthymic disorder: Secondary | ICD-10-CM | POA: Diagnosis not present

## 2017-09-13 ENCOUNTER — Ambulatory Visit: Payer: 59 | Attending: Neurology

## 2017-09-13 DIAGNOSIS — G4733 Obstructive sleep apnea (adult) (pediatric): Secondary | ICD-10-CM | POA: Diagnosis not present

## 2017-09-15 DIAGNOSIS — L84 Corns and callosities: Secondary | ICD-10-CM | POA: Diagnosis not present

## 2017-09-15 DIAGNOSIS — Z86018 Personal history of other benign neoplasm: Secondary | ICD-10-CM | POA: Diagnosis not present

## 2017-09-15 DIAGNOSIS — D229 Melanocytic nevi, unspecified: Secondary | ICD-10-CM | POA: Diagnosis not present

## 2017-09-15 DIAGNOSIS — L821 Other seborrheic keratosis: Secondary | ICD-10-CM | POA: Diagnosis not present

## 2017-09-15 DIAGNOSIS — B360 Pityriasis versicolor: Secondary | ICD-10-CM | POA: Diagnosis not present

## 2017-09-15 DIAGNOSIS — L820 Inflamed seborrheic keratosis: Secondary | ICD-10-CM | POA: Diagnosis not present

## 2017-09-28 DIAGNOSIS — E7849 Other hyperlipidemia: Secondary | ICD-10-CM | POA: Diagnosis not present

## 2017-09-28 DIAGNOSIS — E1165 Type 2 diabetes mellitus with hyperglycemia: Secondary | ICD-10-CM | POA: Diagnosis not present

## 2017-10-05 DIAGNOSIS — G4733 Obstructive sleep apnea (adult) (pediatric): Secondary | ICD-10-CM | POA: Diagnosis not present

## 2017-10-05 DIAGNOSIS — E1165 Type 2 diabetes mellitus with hyperglycemia: Secondary | ICD-10-CM | POA: Diagnosis not present

## 2017-10-05 DIAGNOSIS — I10 Essential (primary) hypertension: Secondary | ICD-10-CM | POA: Diagnosis not present

## 2017-10-05 DIAGNOSIS — K219 Gastro-esophageal reflux disease without esophagitis: Secondary | ICD-10-CM | POA: Diagnosis not present

## 2017-10-09 DIAGNOSIS — F341 Dysthymic disorder: Secondary | ICD-10-CM | POA: Diagnosis not present

## 2017-10-09 DIAGNOSIS — F410 Panic disorder [episodic paroxysmal anxiety] without agoraphobia: Secondary | ICD-10-CM | POA: Diagnosis not present

## 2017-10-27 DIAGNOSIS — F410 Panic disorder [episodic paroxysmal anxiety] without agoraphobia: Secondary | ICD-10-CM | POA: Diagnosis not present

## 2017-10-27 DIAGNOSIS — F341 Dysthymic disorder: Secondary | ICD-10-CM | POA: Diagnosis not present

## 2018-01-01 DIAGNOSIS — D1802 Hemangioma of intracranial structures: Secondary | ICD-10-CM | POA: Diagnosis not present

## 2018-01-01 DIAGNOSIS — E1165 Type 2 diabetes mellitus with hyperglycemia: Secondary | ICD-10-CM | POA: Diagnosis not present

## 2018-01-01 DIAGNOSIS — E7849 Other hyperlipidemia: Secondary | ICD-10-CM | POA: Diagnosis not present

## 2018-01-01 DIAGNOSIS — I10 Essential (primary) hypertension: Secondary | ICD-10-CM | POA: Diagnosis not present

## 2018-01-01 DIAGNOSIS — E349 Endocrine disorder, unspecified: Secondary | ICD-10-CM | POA: Diagnosis not present

## 2018-01-08 DIAGNOSIS — E1165 Type 2 diabetes mellitus with hyperglycemia: Secondary | ICD-10-CM | POA: Diagnosis not present

## 2018-01-08 DIAGNOSIS — K219 Gastro-esophageal reflux disease without esophagitis: Secondary | ICD-10-CM | POA: Diagnosis not present

## 2018-01-08 DIAGNOSIS — I10 Essential (primary) hypertension: Secondary | ICD-10-CM | POA: Diagnosis not present

## 2018-01-08 DIAGNOSIS — G4733 Obstructive sleep apnea (adult) (pediatric): Secondary | ICD-10-CM | POA: Diagnosis not present

## 2018-01-09 ENCOUNTER — Other Ambulatory Visit: Payer: Self-pay | Admitting: Internal Medicine

## 2018-01-09 DIAGNOSIS — R7989 Other specified abnormal findings of blood chemistry: Secondary | ICD-10-CM

## 2018-01-09 DIAGNOSIS — D1802 Hemangioma of intracranial structures: Secondary | ICD-10-CM

## 2018-01-09 DIAGNOSIS — E229 Hyperfunction of pituitary gland, unspecified: Secondary | ICD-10-CM

## 2018-01-16 DIAGNOSIS — F410 Panic disorder [episodic paroxysmal anxiety] without agoraphobia: Secondary | ICD-10-CM | POA: Diagnosis not present

## 2018-01-16 DIAGNOSIS — F341 Dysthymic disorder: Secondary | ICD-10-CM | POA: Diagnosis not present

## 2018-01-19 ENCOUNTER — Ambulatory Visit
Admission: RE | Admit: 2018-01-19 | Discharge: 2018-01-19 | Disposition: A | Discharge status: Home / Self Care | Payer: 59 | Source: Ambulatory Visit | Attending: Internal Medicine | Admitting: Internal Medicine

## 2018-01-19 ENCOUNTER — Encounter (INDEPENDENT_AMBULATORY_CARE_PROVIDER_SITE_OTHER): Payer: Self-pay

## 2018-01-19 DIAGNOSIS — E221 Hyperprolactinemia: Secondary | ICD-10-CM | POA: Diagnosis not present

## 2018-01-19 DIAGNOSIS — E229 Hyperfunction of pituitary gland, unspecified: Secondary | ICD-10-CM | POA: Insufficient documentation

## 2018-01-19 DIAGNOSIS — D1802 Hemangioma of intracranial structures: Secondary | ICD-10-CM | POA: Insufficient documentation

## 2018-01-19 DIAGNOSIS — R7989 Other specified abnormal findings of blood chemistry: Secondary | ICD-10-CM

## 2018-01-19 MED ORDER — GADOBENATE DIMEGLUMINE 529 MG/ML IV SOLN
9.0000 mL | Freq: Once | INTRAVENOUS | Status: AC | PRN
  Administered 2018-01-19: 9 mL via INTRAVENOUS

## 2018-02-13 DIAGNOSIS — D1801 Hemangioma of skin and subcutaneous tissue: Secondary | ICD-10-CM | POA: Diagnosis not present

## 2018-02-13 DIAGNOSIS — Z6832 Body mass index (BMI) 32.0-32.9, adult: Secondary | ICD-10-CM | POA: Diagnosis not present

## 2018-02-13 DIAGNOSIS — R030 Elevated blood-pressure reading, without diagnosis of hypertension: Secondary | ICD-10-CM | POA: Diagnosis not present

## 2018-04-10 DIAGNOSIS — I10 Essential (primary) hypertension: Secondary | ICD-10-CM | POA: Diagnosis not present

## 2018-04-10 DIAGNOSIS — E785 Hyperlipidemia, unspecified: Secondary | ICD-10-CM | POA: Diagnosis not present

## 2018-04-10 DIAGNOSIS — E1165 Type 2 diabetes mellitus with hyperglycemia: Secondary | ICD-10-CM | POA: Diagnosis not present

## 2018-04-10 DIAGNOSIS — E349 Endocrine disorder, unspecified: Secondary | ICD-10-CM | POA: Diagnosis not present

## 2018-04-17 DIAGNOSIS — E1165 Type 2 diabetes mellitus with hyperglycemia: Secondary | ICD-10-CM | POA: Diagnosis not present

## 2018-04-17 DIAGNOSIS — I10 Essential (primary) hypertension: Secondary | ICD-10-CM | POA: Diagnosis not present

## 2018-04-17 DIAGNOSIS — G4733 Obstructive sleep apnea (adult) (pediatric): Secondary | ICD-10-CM | POA: Diagnosis not present

## 2018-04-17 DIAGNOSIS — K219 Gastro-esophageal reflux disease without esophagitis: Secondary | ICD-10-CM | POA: Diagnosis not present

## 2018-04-19 DIAGNOSIS — F410 Panic disorder [episodic paroxysmal anxiety] without agoraphobia: Secondary | ICD-10-CM | POA: Diagnosis not present

## 2018-04-19 DIAGNOSIS — F341 Dysthymic disorder: Secondary | ICD-10-CM | POA: Diagnosis not present

## 2018-05-15 DIAGNOSIS — F410 Panic disorder [episodic paroxysmal anxiety] without agoraphobia: Secondary | ICD-10-CM | POA: Diagnosis not present

## 2018-05-15 DIAGNOSIS — F341 Dysthymic disorder: Secondary | ICD-10-CM | POA: Diagnosis not present

## 2018-08-10 DIAGNOSIS — D1802 Hemangioma of intracranial structures: Secondary | ICD-10-CM | POA: Diagnosis not present

## 2018-08-10 DIAGNOSIS — G4733 Obstructive sleep apnea (adult) (pediatric): Secondary | ICD-10-CM | POA: Diagnosis not present

## 2018-08-10 DIAGNOSIS — E1165 Type 2 diabetes mellitus with hyperglycemia: Secondary | ICD-10-CM | POA: Diagnosis not present

## 2018-08-10 DIAGNOSIS — E7849 Other hyperlipidemia: Secondary | ICD-10-CM | POA: Diagnosis not present

## 2018-08-10 DIAGNOSIS — K219 Gastro-esophageal reflux disease without esophagitis: Secondary | ICD-10-CM | POA: Diagnosis not present

## 2018-08-10 DIAGNOSIS — E349 Endocrine disorder, unspecified: Secondary | ICD-10-CM | POA: Diagnosis not present

## 2018-08-10 DIAGNOSIS — Z125 Encounter for screening for malignant neoplasm of prostate: Secondary | ICD-10-CM | POA: Diagnosis not present

## 2018-08-10 DIAGNOSIS — I10 Essential (primary) hypertension: Secondary | ICD-10-CM | POA: Diagnosis not present

## 2018-08-13 DIAGNOSIS — F410 Panic disorder [episodic paroxysmal anxiety] without agoraphobia: Secondary | ICD-10-CM | POA: Diagnosis not present

## 2018-08-13 DIAGNOSIS — F341 Dysthymic disorder: Secondary | ICD-10-CM | POA: Diagnosis not present

## 2018-08-17 DIAGNOSIS — K219 Gastro-esophageal reflux disease without esophagitis: Secondary | ICD-10-CM | POA: Diagnosis not present

## 2018-08-17 DIAGNOSIS — G4733 Obstructive sleep apnea (adult) (pediatric): Secondary | ICD-10-CM | POA: Diagnosis not present

## 2018-08-17 DIAGNOSIS — I10 Essential (primary) hypertension: Secondary | ICD-10-CM | POA: Diagnosis not present

## 2018-08-17 DIAGNOSIS — E1165 Type 2 diabetes mellitus with hyperglycemia: Secondary | ICD-10-CM | POA: Diagnosis not present

## 2018-09-21 DIAGNOSIS — Z86018 Personal history of other benign neoplasm: Secondary | ICD-10-CM | POA: Diagnosis not present

## 2018-09-21 DIAGNOSIS — L821 Other seborrheic keratosis: Secondary | ICD-10-CM | POA: Diagnosis not present

## 2018-09-21 DIAGNOSIS — B360 Pityriasis versicolor: Secondary | ICD-10-CM | POA: Diagnosis not present

## 2018-09-21 DIAGNOSIS — D229 Melanocytic nevi, unspecified: Secondary | ICD-10-CM | POA: Diagnosis not present

## 2018-10-19 MED FILL — ROSUVASTATIN CALCIUM 20 MG: 20 | 90 days supply | Qty: 90 | Fill #0

## 2018-10-19 MED FILL — metFORMIN HCL 1000 MG TABS: 1000 | 90 days supply | Qty: 180 | Fill #0

## 2018-10-23 MED FILL — TESTOSTERONE CYP 200 MG/ML: 200 | 28 days supply | Qty: 2 | Fill #0

## 2018-11-02 MED FILL — LOSARTAN POTASSIUM 100 MG T: 100 | 30 days supply | Qty: 30 | Fill #0

## 2018-11-12 MED FILL — METOPROLOL TARTRATE 25 MG T: 25 | 30 days supply | Qty: 60 | Fill #0

## 2018-11-12 MED FILL — JARDIANCE 10 MG TABLET: 10 | 30 days supply | Qty: 30 | Fill #0

## 2018-11-19 MED FILL — TESTOSTERONE CYPIONATE 200: 200 | 28 days supply | Qty: 2 | Fill #1

## 2018-11-27 MED FILL — LOSARTAN POTASSIUM 100 MG T: 100 | 30 days supply | Qty: 30 | Fill #1

## 2018-12-03 MED FILL — ESOMEPRAZOLE MAG DR 40 MG C: 40 | 90 days supply | Qty: 180 | Fill #0

## 2018-12-06 MED FILL — JARDIANCE 10 MG TABLET: 10 | 30 days supply | Qty: 30 | Fill #1

## 2018-12-06 MED FILL — METOPROLOL TARTRATE 25 MG T: 25 | 30 days supply | Qty: 60 | Fill #1

## 2018-12-18 MED FILL — glipiZIDE ER 10 MG TB24: 10 | 90 days supply | Qty: 90 | Fill #0

## 2018-12-18 MED FILL — TESTOSTERONE CYPIONATE 200: 200 | 28 days supply | Qty: 2 | Fill #2

## 2018-12-19 DIAGNOSIS — F341 Dysthymic disorder: Secondary | ICD-10-CM | POA: Diagnosis not present

## 2018-12-19 DIAGNOSIS — F410 Panic disorder [episodic paroxysmal anxiety] without agoraphobia: Secondary | ICD-10-CM | POA: Diagnosis not present

## 2018-12-19 MED FILL — clonazePAM 1 MG TABS: 1 | 90 days supply | Qty: 180 | Fill #0

## 2018-12-20 MED FILL — DULOXETINE HCL 60 MG CPEP: 60 | 90 days supply | Qty: 90 | Fill #0

## 2018-12-26 MED FILL — LOSARTAN POTASSIUM 100 MG T: 100 | 30 days supply | Qty: 30 | Fill #2

## 2019-01-05 MED FILL — METOPROLOL TARTRATE 25 MG T: 25 | 30 days supply | Qty: 60 | Fill #2

## 2019-01-05 MED FILL — JARDIANCE 10 MG TABLET: 10 | 30 days supply | Qty: 30 | Fill #2

## 2019-01-11 MED FILL — metFORMIN HCL 1000 MG TABS: 1000 | 90 days supply | Qty: 180 | Fill #1

## 2019-01-11 MED FILL — ROSUVASTATIN CALCIUM 20 MG: 20 | 30 days supply | Qty: 30 | Fill #1

## 2019-01-14 MED FILL — FENOFIBRATE 160 MG TABLET: 160 | 30 days supply | Qty: 30 | Fill #0

## 2019-01-14 MED FILL — TESTOSTERONE CYPIONATE 200: 200 | 28 days supply | Qty: 2 | Fill #3

## 2019-01-25 MED FILL — LOSARTAN POTASSIUM 100 MG T: 100 | 30 days supply | Qty: 30 | Fill #3

## 2019-02-01 DIAGNOSIS — E7849 Other hyperlipidemia: Secondary | ICD-10-CM | POA: Diagnosis not present

## 2019-02-01 DIAGNOSIS — E1165 Type 2 diabetes mellitus with hyperglycemia: Secondary | ICD-10-CM | POA: Diagnosis not present

## 2019-02-01 DIAGNOSIS — E349 Endocrine disorder, unspecified: Secondary | ICD-10-CM | POA: Diagnosis not present

## 2019-02-01 DIAGNOSIS — I10 Essential (primary) hypertension: Secondary | ICD-10-CM | POA: Diagnosis not present

## 2019-02-01 MED FILL — JARDIANCE 10 MG TABLET: 10 | 90 days supply | Qty: 90 | Fill #3

## 2019-02-01 MED FILL — METOPROLOL TARTRATE 25 MG T: 25 | 30 days supply | Qty: 60 | Fill #3

## 2019-02-07 MED FILL — FENOFIBRATE 160 MG TABLET: 160 | 90 days supply | Qty: 90 | Fill #0

## 2019-02-11 MED FILL — ROSUVASTATIN CALCIUM 20 MG: 20 | 90 days supply | Qty: 90 | Fill #0

## 2019-02-11 MED FILL — TESTOSTERONE CYPIONATE 200: 200 | 28 days supply | Qty: 2 | Fill #4

## 2019-02-15 DIAGNOSIS — K219 Gastro-esophageal reflux disease without esophagitis: Secondary | ICD-10-CM | POA: Diagnosis not present

## 2019-02-15 DIAGNOSIS — E1165 Type 2 diabetes mellitus with hyperglycemia: Secondary | ICD-10-CM | POA: Diagnosis not present

## 2019-02-15 DIAGNOSIS — I10 Essential (primary) hypertension: Secondary | ICD-10-CM | POA: Diagnosis not present

## 2019-02-15 DIAGNOSIS — G4733 Obstructive sleep apnea (adult) (pediatric): Secondary | ICD-10-CM | POA: Diagnosis not present

## 2019-02-24 MED FILL — LOSARTAN POTASSIUM 100 MG T: 100 | 30 days supply | Qty: 30 | Fill #4

## 2019-02-25 MED FILL — ESOMEPRAZOLE MAG DR 40 MG C: 40 | 90 days supply | Qty: 180 | Fill #1

## 2019-03-03 MED FILL — METOPROLOL TARTRATE 25 MG T: 25 | 30 days supply | Qty: 60 | Fill #4

## 2019-03-11 MED FILL — TESTOSTERONE CYP 200 MG/ML: 200 | 84 days supply | Qty: 6 | Fill #0

## 2019-03-12 MED FILL — glipiZIDE ER 10 MG TB24: 10 | 90 days supply | Qty: 90 | Fill #1

## 2019-03-14 MED FILL — DULOXETINE HCL 60 MG CPEP: 60 | 90 days supply | Qty: 90 | Fill #1

## 2019-03-16 DIAGNOSIS — Z1159 Encounter for screening for other viral diseases: Secondary | ICD-10-CM | POA: Diagnosis not present

## 2019-03-17 MED FILL — clonazePAM 1 MG TABS: 1 | 90 days supply | Qty: 180 | Fill #1

## 2019-03-19 DIAGNOSIS — K635 Polyp of colon: Secondary | ICD-10-CM | POA: Diagnosis not present

## 2019-03-19 DIAGNOSIS — D123 Benign neoplasm of transverse colon: Secondary | ICD-10-CM | POA: Diagnosis not present

## 2019-03-19 DIAGNOSIS — Z9049 Acquired absence of other specified parts of digestive tract: Secondary | ICD-10-CM | POA: Diagnosis not present

## 2019-03-19 DIAGNOSIS — D122 Benign neoplasm of ascending colon: Secondary | ICD-10-CM | POA: Diagnosis not present

## 2019-03-19 DIAGNOSIS — Z7984 Long term (current) use of oral hypoglycemic drugs: Secondary | ICD-10-CM | POA: Diagnosis not present

## 2019-03-19 DIAGNOSIS — D120 Benign neoplasm of cecum: Secondary | ICD-10-CM | POA: Diagnosis not present

## 2019-03-19 DIAGNOSIS — E785 Hyperlipidemia, unspecified: Secondary | ICD-10-CM | POA: Diagnosis not present

## 2019-03-19 DIAGNOSIS — Z86010 Personal history of colonic polyps: Secondary | ICD-10-CM | POA: Diagnosis not present

## 2019-03-19 DIAGNOSIS — E119 Type 2 diabetes mellitus without complications: Secondary | ICD-10-CM | POA: Diagnosis not present

## 2019-03-19 DIAGNOSIS — Z1211 Encounter for screening for malignant neoplasm of colon: Secondary | ICD-10-CM | POA: Diagnosis not present

## 2019-03-19 DIAGNOSIS — D125 Benign neoplasm of sigmoid colon: Secondary | ICD-10-CM | POA: Diagnosis not present

## 2019-03-19 DIAGNOSIS — K640 First degree hemorrhoids: Secondary | ICD-10-CM | POA: Diagnosis not present

## 2019-03-19 DIAGNOSIS — I10 Essential (primary) hypertension: Secondary | ICD-10-CM | POA: Diagnosis not present

## 2019-03-26 MED FILL — LOSARTAN POTASSIUM 100 MG T: 100 | 30 days supply | Qty: 30 | Fill #5

## 2019-04-02 MED FILL — METOPROLOL TARTRATE 25 MG T: 25 | 30 days supply | Qty: 60 | Fill #5

## 2019-04-16 DIAGNOSIS — F341 Dysthymic disorder: Secondary | ICD-10-CM | POA: Diagnosis not present

## 2019-04-16 DIAGNOSIS — F410 Panic disorder [episodic paroxysmal anxiety] without agoraphobia: Secondary | ICD-10-CM | POA: Diagnosis not present

## 2019-04-26 MED FILL — LOSARTAN POTASSIUM 100 MG T: 100 | 30 days supply | Qty: 30 | Fill #6

## 2019-05-02 MED FILL — metFORMIN HCL 1000 MG TABS: 1000 | 90 days supply | Qty: 180 | Fill #0

## 2019-05-02 MED FILL — METOPROLOL TARTRATE 25 MG T: 25 | 90 days supply | Qty: 180 | Fill #0

## 2019-05-02 MED FILL — FENOFIBRATE 160 MG TABLET: 160 | 90 days supply | Qty: 90 | Fill #1

## 2019-05-02 MED FILL — JARDIANCE 10 MG TABLET: 10 | 90 days supply | Qty: 90 | Fill #4

## 2019-05-06 MED FILL — ROSUVASTATIN CALCIUM 20 MG: 20 | 30 days supply | Qty: 30 | Fill #1

## 2019-05-08 DIAGNOSIS — Z23 Encounter for immunization: Secondary | ICD-10-CM | POA: Diagnosis not present

## 2019-05-25 MED FILL — LOSARTAN POTASSIUM 100 MG T: 100 | 30 days supply | Qty: 30 | Fill #7

## 2019-05-27 MED FILL — ESOMEPRAZOLE MAG DR 40 MG C: 40 | 90 days supply | Qty: 180 | Fill #0

## 2019-05-28 IMAGING — MR MR HEAD WO/W CM
19 series · 48 of 48 positions shown · IV contrast (9 ML MULTIHANCE)
Comparison: 07/29/2015 brain MRI from [HOSPITAL] [REDACTED]

CLINICAL DATA: Elevated prolactin level. History of cavernous
hemangioma.

EXAM:
MRI HEAD WITHOUT AND WITH CONTRAST
TECHNIQUE: Multiplanar, multiecho pulse sequences of the brain and surrounding
structures were obtained without and with intravenous contrast.
CONTRAST:  9mL MULTIHANCE GADOBENATE DIMEGLUMINE 529 MG/ML IV SOLN

[Series 2: T1 · sagittal · 5.0mm · 0.45mm/px · 1 of 25 slices shown (1 of 5)]
[im 1/25]
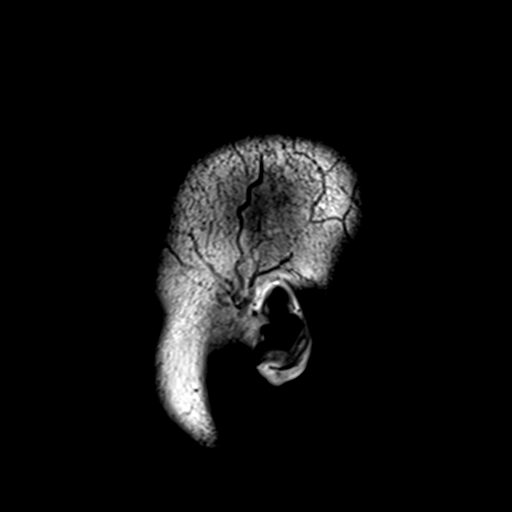

[Series 4: DWI · axial · 3.0mm · 1.20mm/px · z∈[-71,+90]mm · 5 of 55 slices shown (1 of 2)]
[im 1/55]
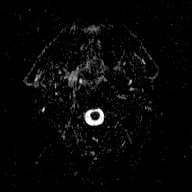
[im 14/55]
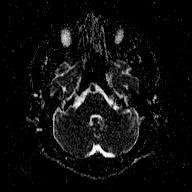
[im 28/55]
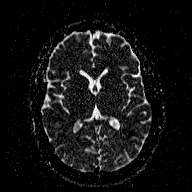
[im 41/55]
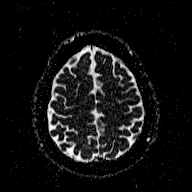
[im 55/55]
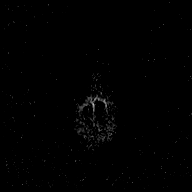

[Series 5: T2 · axial · 5.0mm · 0.90mm/px · z∈[-79,+100]mm · 3 of 31 slices shown (1 of 2)]
[im 1/31]
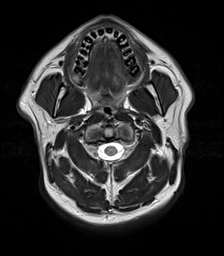
[im 16/31]
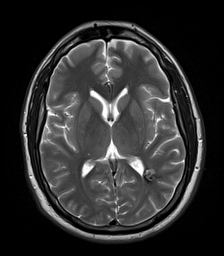
[im 31/31]
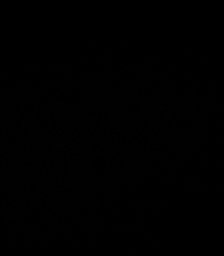

[Series 6: FLAIR · axial · 3.0mm · 0.45mm/px · z∈[-65,+86]mm · 4 of 43 slices shown]
[im 1/43]
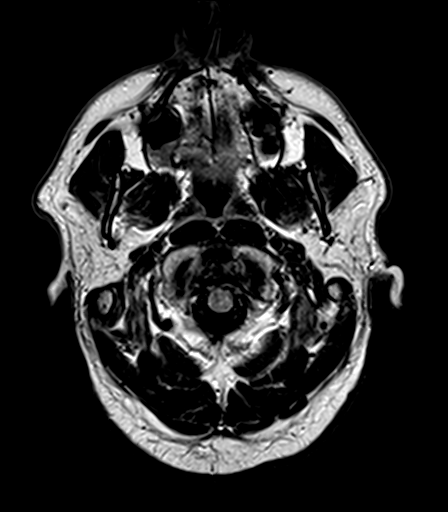
[im 15/43]
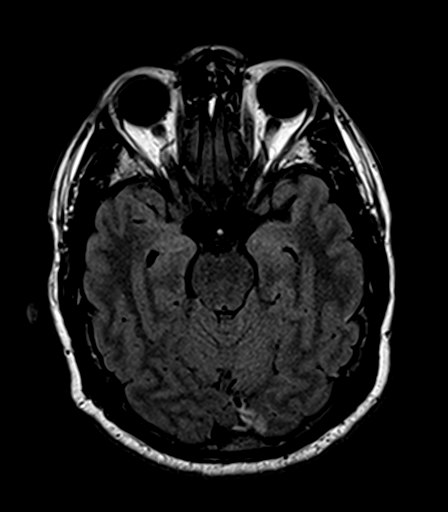
[im 29/43]
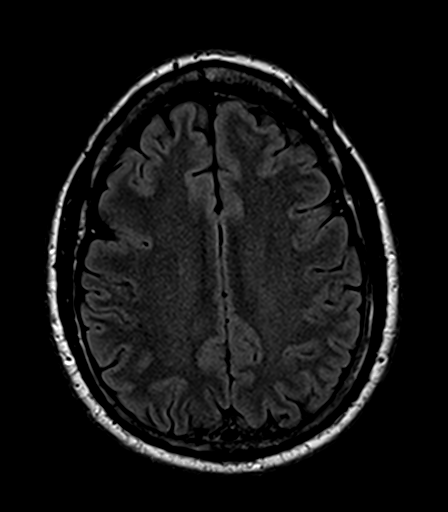
[im 43/43]
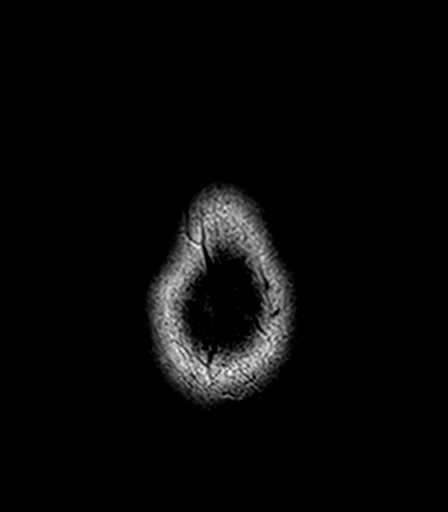

[Series 7: T2 · axial · 5.0mm · 0.72mm/px · z∈[-79,+100]mm · 3 of 31 slices shown (2 of 2)]
[im 1/31]
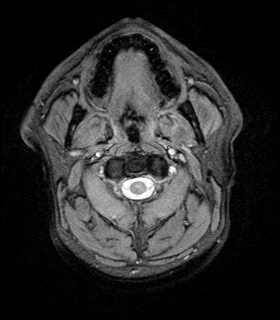
[im 16/31]
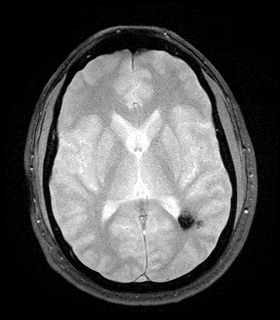
[im 31/31]
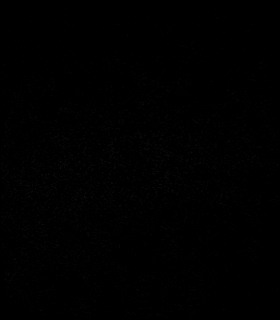

[Series 8: T1 · sagittal · 3.0mm · 0.53mm/px · 1 of 15 slices shown (2 of 5)]
[im 1/15]
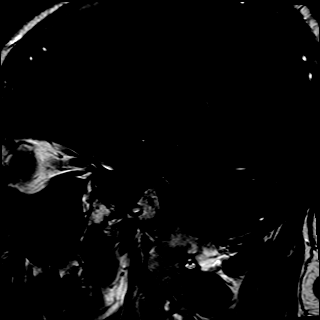

[Series 9: T1 · coronal · 3.0mm · 0.53mm/px · 1 of 15 slices shown (3 of 5)]
[im 1/15]
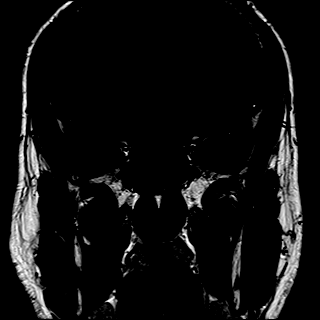

[Series 10: T1 · coronal · non-contrast · 3.0mm · 0.42mm/px · 1 of 11 slices shown (4 of 5)]
[im 1/11]
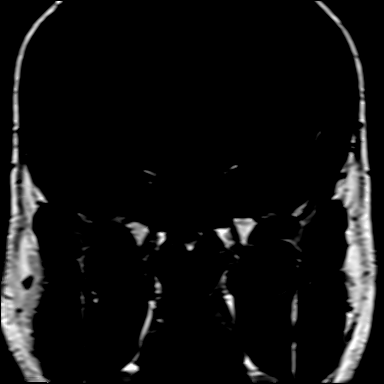

[Series 11: T1 post-contrast · coronal · 3.0mm · 0.62mm/px · 1 of 11 slices shown (1 of 9)]
[im 1/11]
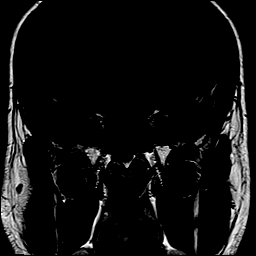

[Series 12: T1 post-contrast · coronal · 3.0mm · 0.62mm/px · 1 of 11 slices shown (2 of 9)]
[im 1/11]
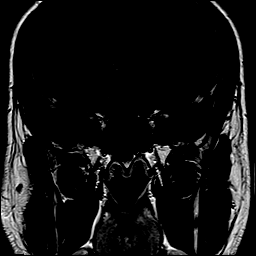

[Series 13: T1 post-contrast · coronal · 3.0mm · 0.62mm/px · 1 of 11 slices shown (3 of 9)]
[im 1/11]
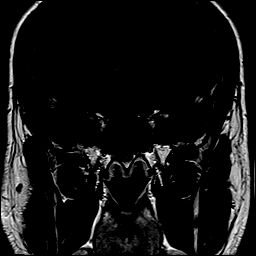

[Series 14: T1 post-contrast · coronal · 3.0mm · 0.62mm/px · 1 of 11 slices shown (4 of 9)]
[im 1/11]
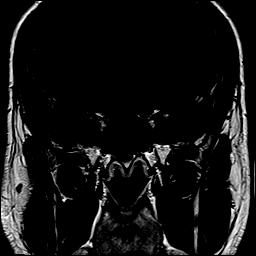

[Series 15: T1 post-contrast · coronal · 3.0mm · 0.62mm/px · 1 of 11 slices shown (5 of 9)]
[im 1/11]
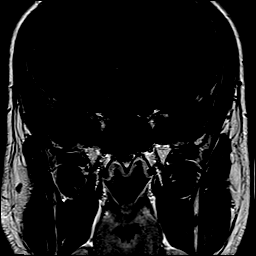

[Series 16: T1 post-contrast · coronal · 3.0mm · 0.62mm/px · 1 of 11 slices shown (6 of 9)]
[im 1/11]
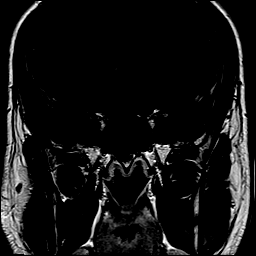

[Series 17: T1 post-contrast · sagittal · 3.0mm · 0.53mm/px · 1 of 15 slices shown (7 of 9)]
[im 1/15]
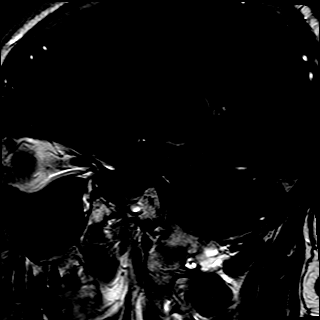

[Series 18: T1 post-contrast · coronal · 3.0mm · 0.44mm/px · 1 of 15 slices shown (8 of 9)]
[im 1/15]
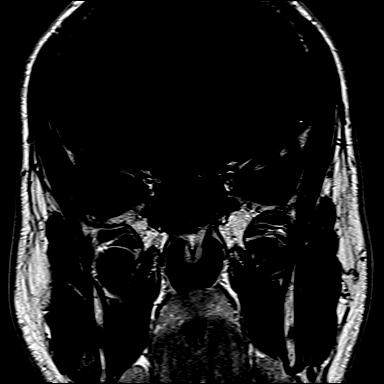

[Series 19: T1 · axial · 1.0mm · 1.00mm/px · z∈[-68,+90]mm · 13 of 160 slices shown (5 of 5)]
[im 1/160]
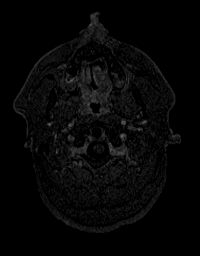
[im 14/160]
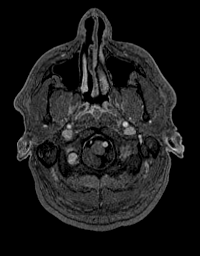
[im 27/160]
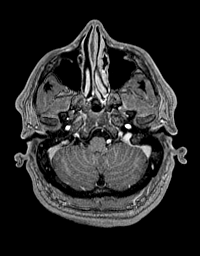
[im 40/160]
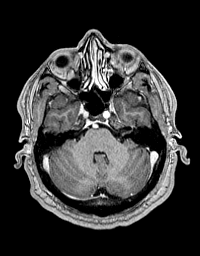
[im 54/160]
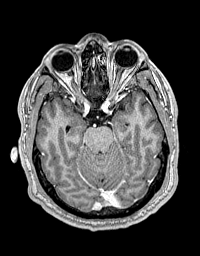
[im 67/160]
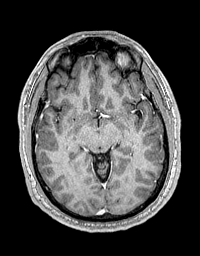
[im 80/160]
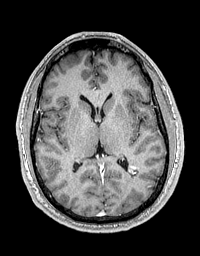
[im 93/160]
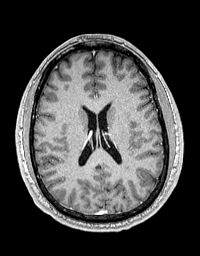
[im 107/160]
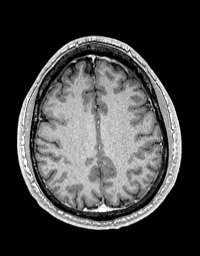
[im 120/160]
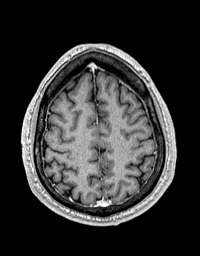
[im 133/160]
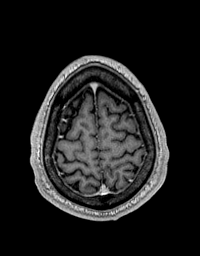
[im 146/160]
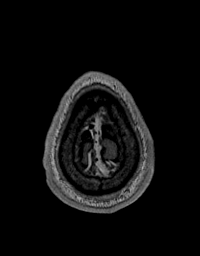
[im 160/160]
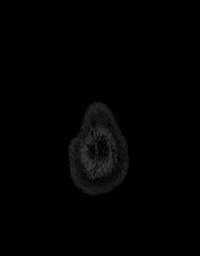

[Series 20: T1 post-contrast · coronal · 5.0mm · 0.43mm/px · 3 of 33 slices shown (9 of 9)]
[im 1/33]
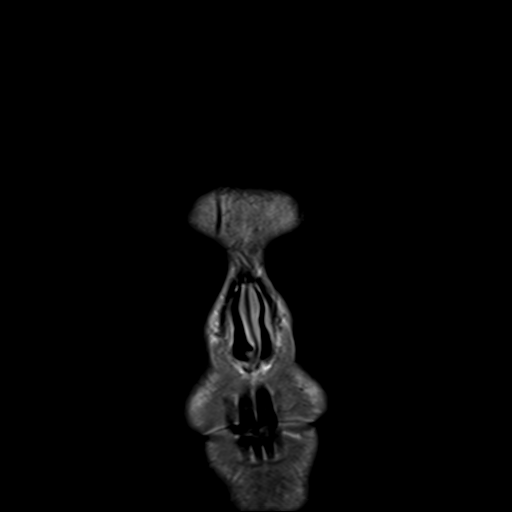
[im 17/33]
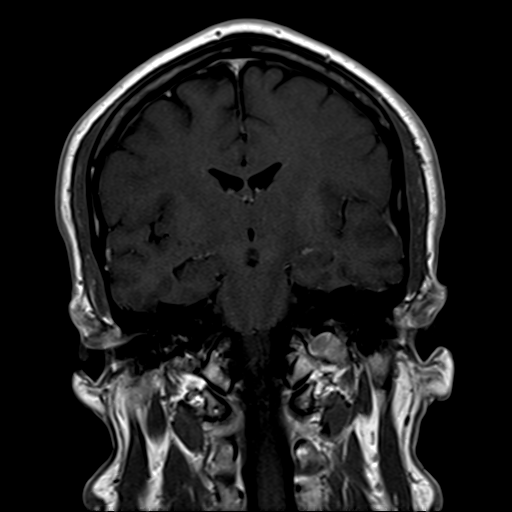
[im 33/33]
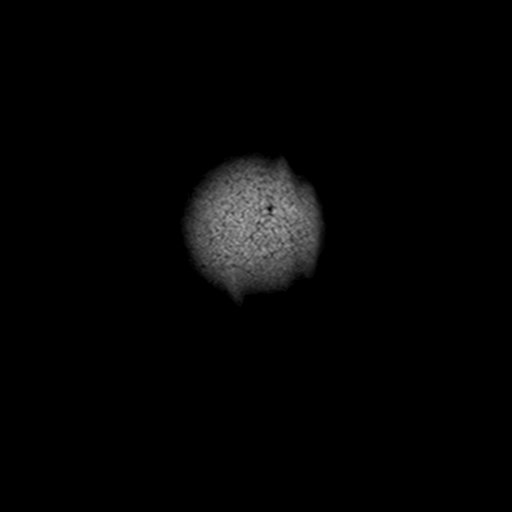

[Series 100: DWI · axial · 3.0mm · 1.20mm/px · z∈[-71,+90]mm · 5 of 55 slices shown (2 of 2)]
[im 1/55]
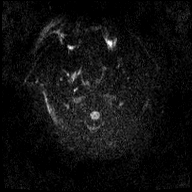
[im 14/55]
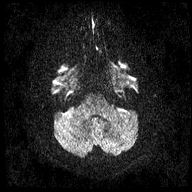
[im 28/55]
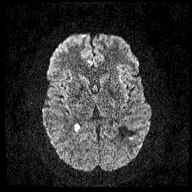
[im 41/55]
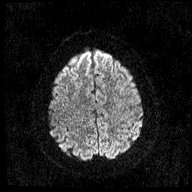
[im 55/55]
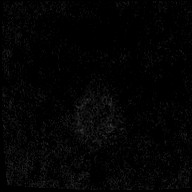

[48 of 48 positions shown; findings below may reference images not displayed]

FINDINGS: Brain: There is no evidence of acute infarct, intracranial mass
effect, or extra-axial fluid collection. The ventricles and sulci
are normal. A left temporoparietal lesion is again noted adjacent to
the atrium of the lateral ventricle with mixed T1 and T2 signal
intensity and prominent susceptibility artifact consistent with a
cavernoma and appearing slightly larger along its anteroinferior
aspect with T1 hyperintense blood products noted but no convincing
enhancement or edema. There is likely a small associated
developmental venous anomaly. The brain is normal in signal
elsewhere.

Dedicated pituitary imaging was performed. Overall pituitary size
and morphology are within normal limits, and there is a normal
posterior pituitary bright spot. The pituitary enhances
homogeneously, and no sellar or suprasellar lesion is identified.
The infundibulum is midline. The optic chiasm and cavernous sinuses
are unremarkable.

Vascular: Chronically diminutive appearance of the distal right
vertebral artery. Other major intracranial vascular flow voids are
preserved.

Skull and upper cervical spine: Unremarkable bone marrow signal.

Sinuses/Orbits: Unremarkable orbits. Small right mastoid effusion.
Clear paranasal sinuses.

Other: None.
IMPRESSION: 1. Unremarkable pituitary imaging.
2. Slight enlargement of left temporoparietal cavernoma.  No edema.

## 2019-06-05 MED FILL — ROSUVASTATIN CALCIUM 20 MG: 20 | 30 days supply | Qty: 30 | Fill #0

## 2019-06-10 MED FILL — glipiZIDE ER 10 MG TB24: 10 | 90 days supply | Qty: 90 | Fill #2

## 2019-06-12 MED FILL — DULOXETINE HCL 60 MG CPEP: 60 | 90 days supply | Qty: 90 | Fill #0

## 2019-06-15 MED FILL — clonazePAM 1 MG TABS: 1 | 90 days supply | Qty: 180 | Fill #0

## 2019-07-11 MED FILL — LOSARTAN POTASSIUM 100 MG T: 100 | 30 days supply | Qty: 30 | Fill #8

## 2019-07-26 MED FILL — metFORMIN HCL 1000 MG TABS: 1000 | 90 days supply | Qty: 180 | Fill #1

## 2019-07-26 MED FILL — METOPROLOL TARTRATE 25 MG T: 25 | 90 days supply | Qty: 180 | Fill #1

## 2019-07-30 MED FILL — ROSUVASTATIN CALCIUM 20 MG: 20 | 90 days supply | Qty: 90 | Fill #1

## 2019-07-31 MED FILL — FENOFIBRATE 160 MG TABLET: 160 | 90 days supply | Qty: 90 | Fill #0

## 2019-07-31 MED FILL — JARDIANCE 10 MG TABLET: 10 | 30 days supply | Qty: 30 | Fill #5

## 2019-08-05 MED FILL — LOSARTAN POTASSIUM 100 MG T: 100 | 90 days supply | Qty: 90 | Fill #0

## 2019-08-12 DIAGNOSIS — F410 Panic disorder [episodic paroxysmal anxiety] without agoraphobia: Secondary | ICD-10-CM | POA: Diagnosis not present

## 2019-08-12 DIAGNOSIS — F341 Dysthymic disorder: Secondary | ICD-10-CM | POA: Diagnosis not present

## 2019-08-14 DIAGNOSIS — E7849 Other hyperlipidemia: Secondary | ICD-10-CM | POA: Diagnosis not present

## 2019-08-14 DIAGNOSIS — I10 Essential (primary) hypertension: Secondary | ICD-10-CM | POA: Diagnosis not present

## 2019-08-14 DIAGNOSIS — Z125 Encounter for screening for malignant neoplasm of prostate: Secondary | ICD-10-CM | POA: Diagnosis not present

## 2019-08-14 DIAGNOSIS — E349 Endocrine disorder, unspecified: Secondary | ICD-10-CM | POA: Diagnosis not present

## 2019-08-14 DIAGNOSIS — E1165 Type 2 diabetes mellitus with hyperglycemia: Secondary | ICD-10-CM | POA: Diagnosis not present

## 2019-08-19 MED FILL — ESOMEPRAZOLE MAG DR 40 MG C: 40 | 90 days supply | Qty: 180 | Fill #1

## 2019-08-21 DIAGNOSIS — I10 Essential (primary) hypertension: Secondary | ICD-10-CM | POA: Diagnosis not present

## 2019-08-21 DIAGNOSIS — K219 Gastro-esophageal reflux disease without esophagitis: Secondary | ICD-10-CM | POA: Diagnosis not present

## 2019-08-21 DIAGNOSIS — G4733 Obstructive sleep apnea (adult) (pediatric): Secondary | ICD-10-CM | POA: Diagnosis not present

## 2019-08-21 DIAGNOSIS — Z0000 Encounter for general adult medical examination without abnormal findings: Secondary | ICD-10-CM | POA: Diagnosis not present

## 2019-08-26 MED FILL — BD 3 ML SYR/NDLE 22G/1-1.5: 22G X 1-1/2 | 6 days supply | Qty: 6 | Fill #0

## 2019-08-26 MED FILL — BD NEEDLES 18GX1.5: 18G X 1-1/2 | 6 days supply | Qty: 6 | Fill #0

## 2019-08-26 MED FILL — TESTOSTERONE CYP 200 MG/ML: 200 | 84 days supply | Qty: 6 | Fill #2

## 2019-08-26 MED FILL — BD NEEDLES 18GX1.5": 18G X 1-1/2 | 6 days supply | Qty: 6 | Fill #0

## 2019-08-30 MED FILL — JARDIANCE 10 MG TABLET: 10 | 90 days supply | Qty: 90 | Fill #0

## 2019-09-07 MED FILL — DULoxetine HCL 60 MG CPEP: 60 | 90 days supply | Qty: 90 | Fill #1

## 2019-09-09 MED FILL — glipiZIDE ER 10 MG TB24: 10 | 90 days supply | Qty: 90 | Fill #0

## 2019-09-11 MED FILL — clonazePAM 1 MG TABS: 1 | 90 days supply | Qty: 180 | Fill #1

## 2019-09-20 DIAGNOSIS — E7849 Other hyperlipidemia: Secondary | ICD-10-CM | POA: Diagnosis not present

## 2019-09-20 DIAGNOSIS — D582 Other hemoglobinopathies: Secondary | ICD-10-CM | POA: Diagnosis not present

## 2019-10-10 ENCOUNTER — Ambulatory Visit: Payer: 59

## 2019-10-21 MED FILL — ROSUVASTATIN CALCIUM 20 MG: 20 | 90 days supply | Qty: 90 | Fill #0

## 2019-10-23 MED FILL — METOPROLOL TARTRATE 25 MG T: 25 | 90 days supply | Qty: 180 | Fill #0

## 2019-10-23 MED FILL — FENOFIBRATE 160 MG TABLET: 160 | 90 days supply | Qty: 90 | Fill #1

## 2019-10-28 MED FILL — LOSARTAN POTASSIUM 100 MG T: 100 | 90 days supply | Qty: 90 | Fill #1

## 2019-11-04 DIAGNOSIS — F341 Dysthymic disorder: Secondary | ICD-10-CM | POA: Diagnosis not present

## 2019-11-04 DIAGNOSIS — F410 Panic disorder [episodic paroxysmal anxiety] without agoraphobia: Secondary | ICD-10-CM | POA: Diagnosis not present

## 2019-11-04 MED FILL — clonazePAM 0.5 MG TABS: 0.5 | 90 days supply | Qty: 180 | Fill #0

## 2019-11-14 DIAGNOSIS — E7849 Other hyperlipidemia: Secondary | ICD-10-CM | POA: Diagnosis not present

## 2019-11-14 DIAGNOSIS — E349 Endocrine disorder, unspecified: Secondary | ICD-10-CM | POA: Diagnosis not present

## 2019-11-14 DIAGNOSIS — D582 Other hemoglobinopathies: Secondary | ICD-10-CM | POA: Diagnosis not present

## 2019-11-14 DIAGNOSIS — E1165 Type 2 diabetes mellitus with hyperglycemia: Secondary | ICD-10-CM | POA: Diagnosis not present

## 2019-11-18 MED FILL — BD 3 ML SYR/NDLE 22G/1-1.5: 22G X 1-1/2 | 6 days supply | Qty: 6 | Fill #1

## 2019-11-18 MED FILL — TESTOSTERONE CYP 200 MG/ML: 200 | 84 days supply | Qty: 6 | Fill #0

## 2019-11-18 MED FILL — BD NEEDLES 18GX1.5: 18G X 1-1/2 | 6 days supply | Qty: 6 | Fill #1

## 2019-11-18 MED FILL — ESOMEPRAZOLE MAG DR 40 MG C: 40 | 90 days supply | Qty: 180 | Fill #0

## 2019-11-21 DIAGNOSIS — G4733 Obstructive sleep apnea (adult) (pediatric): Secondary | ICD-10-CM | POA: Diagnosis not present

## 2019-11-21 DIAGNOSIS — E1165 Type 2 diabetes mellitus with hyperglycemia: Secondary | ICD-10-CM | POA: Diagnosis not present

## 2019-11-21 DIAGNOSIS — I10 Essential (primary) hypertension: Secondary | ICD-10-CM | POA: Diagnosis not present

## 2019-11-21 DIAGNOSIS — D1802 Hemangioma of intracranial structures: Secondary | ICD-10-CM | POA: Diagnosis not present

## 2019-11-21 MED FILL — METFORMIN HCL 500 MG TABS: 500 | 30 days supply | Qty: 60 | Fill #0

## 2019-11-22 MED FILL — JARDIANCE 10 MG TABLET: 10 | 30 days supply | Qty: 30 | Fill #1

## 2019-12-16 MED FILL — METFORMIN HCL 500 MG TABS: 500 | 30 days supply | Qty: 60 | Fill #1

## 2019-12-19 MED FILL — SODIUM FLUORIDE 5000 PPM 1.: 1.1 | 30 days supply | Qty: 100 | Fill #0

## 2019-12-25 MED FILL — JARDIANCE 10 MG TABLET: 10 | 30 days supply | Qty: 30 | Fill #0

## 2020-01-13 MED FILL — SODIUM FLUORIDE 5000 PPM 1.: 1.1 | 30 days supply | Qty: 100 | Fill #1

## 2020-01-13 MED FILL — ROSUVASTATIN CALCIUM 20 MG: 20 | 90 days supply | Qty: 90 | Fill #1

## 2020-01-14 MED FILL — METFORMIN HCL 500 MG TABS: 500 | 30 days supply | Qty: 60 | Fill #2

## 2020-01-15 MED FILL — METOPROLOL TARTRATE 25 MG T: 25 | 90 days supply | Qty: 180 | Fill #1

## 2020-01-21 MED FILL — FENOFIBRATE 160 MG TABLET: 160 | 90 days supply | Qty: 90 | Fill #0

## 2020-01-24 MED FILL — JARDIANCE 10 MG TABLET: 10 | 30 days supply | Qty: 30 | Fill #1

## 2020-01-28 ENCOUNTER — Other Ambulatory Visit (HOSPITAL_COMMUNITY): Payer: Self-pay | Admitting: Internal Medicine

## 2020-01-28 MED FILL — clonazePAM 0.5 MG TABS: 0.5 | 90 days supply | Qty: 180 | Fill #1

## 2020-01-28 MED FILL — LOSARTAN POTASSIUM 100 MG T: 100 | 90 days supply | Qty: 90 | Fill #0

## 2020-01-30 ENCOUNTER — Other Ambulatory Visit (HOSPITAL_COMMUNITY): Payer: Self-pay | Admitting: Internal Medicine

## 2020-02-10 MED FILL — ESOMEPRAZOLE MAG DR 40 MG C: 40 | 90 days supply | Qty: 180 | Fill #1

## 2020-02-10 MED FILL — TESTOSTERONE CYP 200 MG/ML: 200 | 84 days supply | Qty: 6 | Fill #1

## 2020-02-11 ENCOUNTER — Other Ambulatory Visit (HOSPITAL_COMMUNITY): Payer: Self-pay | Admitting: Internal Medicine

## 2020-02-11 MED FILL — BD NEEDLE 18GX1 1/2: 18G X 1-1/2 | 84 days supply | Qty: 6 | Fill #0

## 2020-02-11 MED FILL — BD 3 ML SYR/NDLE 22G/1-1.5: 22G X 1-1/2 | 84 days supply | Qty: 6 | Fill #0

## 2020-02-13 MED FILL — METFORMIN HCL 500 MG TABS: 500 | 30 days supply | Qty: 60 | Fill #3

## 2020-02-17 MED FILL — JARDIANCE 10 MG TABLET: 10 | 30 days supply | Qty: 30 | Fill #2

## 2020-02-25 MED FILL — DULOXETINE HCL 60 MG CPEP: 60 | 90 days supply | Qty: 90 | Fill #1

## 2020-03-03 ENCOUNTER — Other Ambulatory Visit (HOSPITAL_COMMUNITY): Payer: Self-pay | Admitting: Psychiatry

## 2020-03-03 DIAGNOSIS — F341 Dysthymic disorder: Secondary | ICD-10-CM | POA: Diagnosis not present

## 2020-03-03 DIAGNOSIS — F410 Panic disorder [episodic paroxysmal anxiety] without agoraphobia: Secondary | ICD-10-CM | POA: Diagnosis not present

## 2020-03-03 MED FILL — GLIPIZIDE ER 10 MG TB24: 10 | 90 days supply | Qty: 90 | Fill #2

## 2020-03-14 MED FILL — METFORMIN HCL 500 MG TABS: 500 | 30 days supply | Qty: 60 | Fill #4

## 2020-03-19 MED FILL — JARDIANCE 10 MG TABLET: 10 | 30 days supply | Qty: 30 | Fill #3

## 2020-04-13 MED FILL — METFORMIN HCL 500 MG TABS: 500 | 30 days supply | Qty: 60 | Fill #5

## 2020-04-13 MED FILL — ROSUVASTATIN CALCIUM 20 MG: 20 | 30 days supply | Qty: 30 | Fill #0

## 2020-04-14 MED FILL — FENOFIBRATE 160 MG TABLET: 160 | 90 days supply | Qty: 90 | Fill #1

## 2020-04-14 MED FILL — METOPROLOL TARTRATE 25 MG T: 25 | 90 days supply | Qty: 180 | Fill #2

## 2020-04-16 DIAGNOSIS — E349 Endocrine disorder, unspecified: Secondary | ICD-10-CM | POA: Diagnosis not present

## 2020-04-16 DIAGNOSIS — E7849 Other hyperlipidemia: Secondary | ICD-10-CM | POA: Diagnosis not present

## 2020-04-16 DIAGNOSIS — E1165 Type 2 diabetes mellitus with hyperglycemia: Secondary | ICD-10-CM | POA: Diagnosis not present

## 2020-04-16 DIAGNOSIS — K219 Gastro-esophageal reflux disease without esophagitis: Secondary | ICD-10-CM | POA: Diagnosis not present

## 2020-04-16 DIAGNOSIS — G4733 Obstructive sleep apnea (adult) (pediatric): Secondary | ICD-10-CM | POA: Diagnosis not present

## 2020-04-16 DIAGNOSIS — Z1159 Encounter for screening for other viral diseases: Secondary | ICD-10-CM | POA: Diagnosis not present

## 2020-04-16 DIAGNOSIS — I10 Essential (primary) hypertension: Secondary | ICD-10-CM | POA: Diagnosis not present

## 2020-04-17 DIAGNOSIS — L814 Other melanin hyperpigmentation: Secondary | ICD-10-CM | POA: Diagnosis not present

## 2020-04-17 DIAGNOSIS — L821 Other seborrheic keratosis: Secondary | ICD-10-CM | POA: Diagnosis not present

## 2020-04-17 DIAGNOSIS — L820 Inflamed seborrheic keratosis: Secondary | ICD-10-CM | POA: Diagnosis not present

## 2020-04-17 DIAGNOSIS — D229 Melanocytic nevi, unspecified: Secondary | ICD-10-CM | POA: Diagnosis not present

## 2020-04-17 DIAGNOSIS — L570 Actinic keratosis: Secondary | ICD-10-CM | POA: Diagnosis not present

## 2020-04-17 DIAGNOSIS — L738 Other specified follicular disorders: Secondary | ICD-10-CM | POA: Diagnosis not present

## 2020-04-17 DIAGNOSIS — Z86018 Personal history of other benign neoplasm: Secondary | ICD-10-CM | POA: Diagnosis not present

## 2020-04-17 MED FILL — JARDIANCE 10 MG TABLET: 10 | 30 days supply | Qty: 30 | Fill #0

## 2020-04-23 ENCOUNTER — Other Ambulatory Visit (HOSPITAL_COMMUNITY): Payer: Self-pay | Admitting: Internal Medicine

## 2020-04-23 DIAGNOSIS — E1165 Type 2 diabetes mellitus with hyperglycemia: Secondary | ICD-10-CM | POA: Diagnosis not present

## 2020-04-23 DIAGNOSIS — G4733 Obstructive sleep apnea (adult) (pediatric): Secondary | ICD-10-CM | POA: Diagnosis not present

## 2020-04-23 DIAGNOSIS — I10 Essential (primary) hypertension: Secondary | ICD-10-CM | POA: Diagnosis not present

## 2020-04-23 DIAGNOSIS — Z23 Encounter for immunization: Secondary | ICD-10-CM | POA: Diagnosis not present

## 2020-04-23 DIAGNOSIS — K219 Gastro-esophageal reflux disease without esophagitis: Secondary | ICD-10-CM | POA: Diagnosis not present

## 2020-04-23 MED FILL — ROSUVASTATIN CALCIUM 40 MG: 40 | 30 days supply | Qty: 30 | Fill #0

## 2020-04-27 MED FILL — clonazePAM 0.5 MG TABS: 0.5 | 90 days supply | Qty: 180 | Fill #0

## 2020-04-27 MED FILL — JARDIANCE 25 MG TABLET: 25 | 30 days supply | Qty: 30 | Fill #0

## 2020-05-04 MED FILL — TESTOSTERONE CYP 200 MG/ML: 200 | 84 days supply | Qty: 6 | Fill #0

## 2020-05-05 ENCOUNTER — Other Ambulatory Visit (HOSPITAL_COMMUNITY): Payer: Self-pay | Admitting: Internal Medicine

## 2020-05-05 MED FILL — EASY TOUCH HYPODERMIC 18GX1: 18G X 1-1/2 | 84 days supply | Qty: 6 | Fill #1

## 2020-05-05 MED FILL — BD 3 ML SYR/NDLE 22G/1-1.5: 22G X 1-1/2 | 84 days supply | Qty: 6 | Fill #1

## 2020-05-11 ENCOUNTER — Other Ambulatory Visit (HOSPITAL_COMMUNITY): Payer: Self-pay | Admitting: Internal Medicine

## 2020-05-11 MED FILL — ESOMEPRAZOLE MAG DR 40 MG C: 40 | 90 days supply | Qty: 180 | Fill #0

## 2020-05-11 MED FILL — JARDIANCE 10 MG TABLET: 10 | 30 days supply | Qty: 30 | Fill #1

## 2020-05-13 MED FILL — LOSARTAN POTASSIUM 100 MG T: 100 | 90 days supply | Qty: 90 | Fill #1

## 2020-05-14 MED FILL — METFORMIN HCL 500 MG TABS: 500 | 30 days supply | Qty: 60 | Fill #6

## 2020-05-19 MED FILL — ROSUVASTATIN CALCIUM 40 MG: 40 | 30 days supply | Qty: 30 | Fill #1

## 2020-05-25 MED FILL — DULOXETINE HCL 60 MG CPEP: 60 | 90 days supply | Qty: 90 | Fill #0

## 2020-06-01 ENCOUNTER — Other Ambulatory Visit (HOSPITAL_COMMUNITY): Payer: Self-pay | Admitting: Internal Medicine

## 2020-06-01 MED FILL — GLIPIZIDE ER 10 MG TB24: 10 | 90 days supply | Qty: 90 | Fill #0

## 2020-06-08 MED FILL — JARDIANCE 25 MG TABLET: 25 | 30 days supply | Qty: 30 | Fill #1

## 2020-06-12 MED FILL — METFORMIN HCL 500 MG TABS: 500 | 30 days supply | Qty: 60 | Fill #7

## 2020-06-21 MED FILL — ROSUVASTATIN CALCIUM 40 MG: 40 | 30 days supply | Qty: 30 | Fill #2

## 2020-07-02 MED FILL — JARDIANCE 25 MG TABLET: 25 | 30 days supply | Qty: 30 | Fill #2

## 2020-07-12 MED FILL — METFORMIN HCL 500 MG TABS: 500 | 30 days supply | Qty: 60 | Fill #8

## 2020-07-13 ENCOUNTER — Other Ambulatory Visit (HOSPITAL_COMMUNITY): Payer: Self-pay | Admitting: Internal Medicine

## 2020-07-13 MED FILL — FENOFIBRATE 160 MG TABLET: 160 | 90 days supply | Qty: 90 | Fill #0

## 2020-07-13 MED FILL — METOPROLOL TARTRATE 25 MG T: 25 | 90 days supply | Qty: 180 | Fill #3

## 2020-07-19 MED FILL — ROSUVASTATIN CALCIUM 40 MG: 40 | 30 days supply | Qty: 30 | Fill #3

## 2020-07-27 MED FILL — TESTOSTERONE CYP 200 MG/ML: 200 | 84 days supply | Qty: 6 | Fill #0

## 2020-07-29 DIAGNOSIS — Z23 Encounter for immunization: Secondary | ICD-10-CM | POA: Diagnosis not present

## 2020-07-30 ENCOUNTER — Other Ambulatory Visit (HOSPITAL_COMMUNITY): Payer: Self-pay | Admitting: Internal Medicine

## 2020-07-30 MED FILL — JARDIANCE 25 MG TABLET: 25 | 30 days supply | Qty: 30 | Fill #3

## 2020-07-30 MED FILL — clonazePAM 0.5 MG TABS: 0.5 | 90 days supply | Qty: 180 | Fill #1

## 2020-07-31 ENCOUNTER — Other Ambulatory Visit (HOSPITAL_COMMUNITY): Payer: Self-pay | Admitting: Internal Medicine

## 2020-07-31 MED FILL — EASY TOUCH HYPODERMIC 18GX1: 18G X 1-1/2 | 84 days supply | Qty: 6 | Fill #0

## 2020-07-31 MED FILL — BD 3 ML SYR/NDLE 22G/1-1.5: 22G X 1-1/2 | 84 days supply | Qty: 6 | Fill #0

## 2020-08-04 MED FILL — LOSARTAN POTASSIUM 100 MG T: 100 | 30 days supply | Qty: 30 | Fill #0

## 2020-08-05 ENCOUNTER — Other Ambulatory Visit (HOSPITAL_COMMUNITY): Payer: Self-pay | Admitting: Internal Medicine

## 2020-08-11 MED FILL — METFORMIN HCL 500 MG TABS: 500 | 30 days supply | Qty: 60 | Fill #9

## 2020-08-20 MED FILL — ROSUVASTATIN CALCIUM 40 MG: 40 | 30 days supply | Qty: 30 | Fill #4

## 2020-08-24 DIAGNOSIS — G8929 Other chronic pain: Secondary | ICD-10-CM | POA: Diagnosis not present

## 2020-08-24 DIAGNOSIS — E1165 Type 2 diabetes mellitus with hyperglycemia: Secondary | ICD-10-CM | POA: Diagnosis not present

## 2020-08-24 DIAGNOSIS — R1011 Right upper quadrant pain: Secondary | ICD-10-CM | POA: Diagnosis not present

## 2020-08-24 DIAGNOSIS — K219 Gastro-esophageal reflux disease without esophagitis: Secondary | ICD-10-CM | POA: Diagnosis not present

## 2020-08-24 DIAGNOSIS — I10 Essential (primary) hypertension: Secondary | ICD-10-CM | POA: Diagnosis not present

## 2020-08-24 DIAGNOSIS — E7849 Other hyperlipidemia: Secondary | ICD-10-CM | POA: Diagnosis not present

## 2020-08-31 ENCOUNTER — Other Ambulatory Visit (HOSPITAL_COMMUNITY): Payer: Self-pay | Admitting: Internal Medicine

## 2020-08-31 DIAGNOSIS — Z0000 Encounter for general adult medical examination without abnormal findings: Secondary | ICD-10-CM | POA: Diagnosis not present

## 2020-08-31 DIAGNOSIS — E7849 Other hyperlipidemia: Secondary | ICD-10-CM | POA: Diagnosis not present

## 2020-08-31 DIAGNOSIS — I10 Essential (primary) hypertension: Secondary | ICD-10-CM | POA: Diagnosis not present

## 2020-08-31 DIAGNOSIS — G4733 Obstructive sleep apnea (adult) (pediatric): Secondary | ICD-10-CM | POA: Diagnosis not present

## 2020-08-31 MED FILL — ESOMEPRAZOLE MAG DR 40 MG C: 40 | 90 days supply | Qty: 180 | Fill #1

## 2020-08-31 MED FILL — GLIPIZIDE ER 10 MG TB24: 10 | 90 days supply | Qty: 180 | Fill #0

## 2020-09-02 MED FILL — JARDIANCE 25 MG TABLET: 25 | 30 days supply | Qty: 30 | Fill #4

## 2020-09-09 MED FILL — LOSARTAN POTASSIUM 100 MG T: 100 | 30 days supply | Qty: 30 | Fill #1

## 2020-09-10 MED FILL — METFORMIN HCL 500 MG TABS: 500 | 30 days supply | Qty: 60 | Fill #10

## 2020-09-19 MED FILL — ROSUVASTATIN CALCIUM 40 MG: 40 | 30 days supply | Qty: 30 | Fill #5

## 2020-10-01 MED FILL — JARDIANCE 25 MG TABLET: 25 | 30 days supply | Qty: 30 | Fill #5

## 2020-10-12 ENCOUNTER — Other Ambulatory Visit (HOSPITAL_COMMUNITY): Payer: Self-pay | Admitting: Internal Medicine

## 2020-10-12 MED FILL — METOPROLOL TARTRATE 25 MG T: 25 | 90 days supply | Qty: 180 | Fill #0

## 2020-10-14 MED FILL — ROSUVASTATIN CALCIUM 40 MG: 40 | 30 days supply | Qty: 30 | Fill #6

## 2020-10-14 MED FILL — DULOXETINE HCL 60 MG CPEP: 60 | 90 days supply | Qty: 90 | Fill #1

## 2020-10-15 ENCOUNTER — Other Ambulatory Visit (HOSPITAL_COMMUNITY): Payer: Self-pay | Admitting: Internal Medicine

## 2020-10-16 ENCOUNTER — Other Ambulatory Visit (HOSPITAL_BASED_OUTPATIENT_CLINIC_OR_DEPARTMENT_OTHER): Payer: Self-pay

## 2020-10-29 ENCOUNTER — Other Ambulatory Visit (HOSPITAL_COMMUNITY): Payer: Self-pay

## 2020-10-29 ENCOUNTER — Other Ambulatory Visit (HOSPITAL_COMMUNITY): Payer: Self-pay | Admitting: Psychiatry

## 2020-10-29 MED FILL — Empagliflozin Tab 25 MG: ORAL | 30 days supply | Qty: 30 | Fill #0 | Status: AC

## 2020-10-29 MED FILL — Metoprolol Tartrate Tab 25 MG: ORAL | 90 days supply | Qty: 180 | Fill #0 | Status: CN

## 2020-10-30 ENCOUNTER — Other Ambulatory Visit (HOSPITAL_COMMUNITY): Payer: Self-pay

## 2020-10-30 MED ORDER — CLONAZEPAM 0.5 MG PO TABS
0.5000 mg | ORAL_TABLET | Freq: Two times a day (BID) | ORAL | 1 refills | Status: DC | PRN
  Filled 2020-10-30: qty 180, 90d supply, fill #0
  Filled 2021-01-27: qty 180, 90d supply, fill #1

## 2020-11-02 ENCOUNTER — Other Ambulatory Visit (HOSPITAL_COMMUNITY): Payer: Self-pay

## 2020-11-08 MED FILL — Losartan Potassium Tab 100 MG: ORAL | 90 days supply | Qty: 90 | Fill #0 | Status: AC

## 2020-11-09 ENCOUNTER — Other Ambulatory Visit (HOSPITAL_COMMUNITY): Payer: Self-pay

## 2020-11-13 ENCOUNTER — Other Ambulatory Visit (HOSPITAL_COMMUNITY): Payer: Self-pay

## 2020-11-13 MED FILL — Metformin HCl Tab 500 MG: ORAL | 30 days supply | Qty: 60 | Fill #0 | Status: AC

## 2020-11-15 MED FILL — Rosuvastatin Calcium Tab 40 MG: ORAL | 30 days supply | Qty: 30 | Fill #0 | Status: AC

## 2020-11-16 ENCOUNTER — Other Ambulatory Visit (HOSPITAL_COMMUNITY): Payer: Self-pay

## 2020-11-25 ENCOUNTER — Other Ambulatory Visit (HOSPITAL_COMMUNITY): Payer: Self-pay

## 2020-11-25 MED FILL — Empagliflozin Tab 25 MG: ORAL | 30 days supply | Qty: 30 | Fill #1 | Status: AC

## 2020-11-26 DIAGNOSIS — E1165 Type 2 diabetes mellitus with hyperglycemia: Secondary | ICD-10-CM | POA: Diagnosis not present

## 2020-11-26 DIAGNOSIS — E7849 Other hyperlipidemia: Secondary | ICD-10-CM | POA: Diagnosis not present

## 2020-11-26 DIAGNOSIS — R300 Dysuria: Secondary | ICD-10-CM | POA: Diagnosis not present

## 2020-11-27 ENCOUNTER — Other Ambulatory Visit (HOSPITAL_COMMUNITY): Payer: Self-pay

## 2020-11-27 ENCOUNTER — Other Ambulatory Visit: Payer: Self-pay

## 2020-11-27 MED ORDER — SULFAMETHOXAZOLE-TRIMETHOPRIM 800-160 MG PO TABS
ORAL_TABLET | ORAL | 0 refills | Status: DC
  Filled 2020-11-27: qty 20, 10d supply, fill #0

## 2020-11-29 MED FILL — Syringe/Needle (Disp) 3 ML 22 x 1-1/2": 30 days supply | Qty: 6 | Fill #0 | Status: AC

## 2020-11-30 ENCOUNTER — Other Ambulatory Visit (HOSPITAL_COMMUNITY): Payer: Self-pay

## 2020-11-30 ENCOUNTER — Other Ambulatory Visit (HOSPITAL_COMMUNITY): Payer: Self-pay | Admitting: Internal Medicine

## 2020-11-30 MED FILL — Glipizide Tab ER 24HR 10 MG: ORAL | 90 days supply | Qty: 180 | Fill #0 | Status: AC

## 2020-12-01 ENCOUNTER — Other Ambulatory Visit (HOSPITAL_COMMUNITY): Payer: Self-pay

## 2020-12-02 ENCOUNTER — Other Ambulatory Visit (HOSPITAL_COMMUNITY): Payer: Self-pay

## 2020-12-02 ENCOUNTER — Other Ambulatory Visit (HOSPITAL_COMMUNITY): Payer: Self-pay | Admitting: Internal Medicine

## 2020-12-03 ENCOUNTER — Other Ambulatory Visit (HOSPITAL_COMMUNITY): Payer: Self-pay

## 2020-12-03 ENCOUNTER — Other Ambulatory Visit: Payer: Self-pay

## 2020-12-03 DIAGNOSIS — E1165 Type 2 diabetes mellitus with hyperglycemia: Secondary | ICD-10-CM | POA: Diagnosis not present

## 2020-12-03 DIAGNOSIS — E349 Endocrine disorder, unspecified: Secondary | ICD-10-CM | POA: Diagnosis not present

## 2020-12-03 DIAGNOSIS — I10 Essential (primary) hypertension: Secondary | ICD-10-CM | POA: Diagnosis not present

## 2020-12-03 DIAGNOSIS — E7849 Other hyperlipidemia: Secondary | ICD-10-CM | POA: Diagnosis not present

## 2020-12-03 MED ORDER — ESOMEPRAZOLE MAGNESIUM 40 MG PO CPDR
DELAYED_RELEASE_CAPSULE | ORAL | 1 refills | Status: DC
  Filled 2020-12-03 – 2021-10-17 (×2): qty 60, 30d supply, fill #0

## 2020-12-03 MED ORDER — ESOMEPRAZOLE MAGNESIUM 40 MG PO CPDR
DELAYED_RELEASE_CAPSULE | ORAL | 1 refills | Status: DC
  Filled 2020-12-03: qty 180, 90d supply, fill #0
  Filled 2021-02-07 – 2021-02-15 (×2): qty 180, 90d supply, fill #1

## 2020-12-11 ENCOUNTER — Other Ambulatory Visit (HOSPITAL_COMMUNITY): Payer: Self-pay

## 2020-12-11 MED FILL — Metformin HCl Tab 500 MG: ORAL | 30 days supply | Qty: 60 | Fill #1 | Status: AC

## 2020-12-11 MED FILL — Rosuvastatin Calcium Tab 40 MG: ORAL | 30 days supply | Qty: 30 | Fill #1 | Status: AC

## 2020-12-22 ENCOUNTER — Other Ambulatory Visit (HOSPITAL_COMMUNITY): Payer: Self-pay

## 2020-12-22 MED FILL — Empagliflozin Tab 25 MG: ORAL | 30 days supply | Qty: 30 | Fill #2 | Status: AC

## 2021-01-10 ENCOUNTER — Other Ambulatory Visit (HOSPITAL_COMMUNITY): Payer: Self-pay

## 2021-01-10 MED FILL — Metformin HCl Tab 500 MG: ORAL | 30 days supply | Qty: 60 | Fill #2 | Status: AC

## 2021-01-11 ENCOUNTER — Other Ambulatory Visit (HOSPITAL_COMMUNITY): Payer: Self-pay

## 2021-01-11 MED ORDER — DULOXETINE HCL 60 MG PO CPEP
ORAL_CAPSULE | ORAL | 1 refills | Status: DC
  Filled 2021-01-11: qty 90, 90d supply, fill #0
  Filled 2021-02-26 – 2021-04-10 (×2): qty 90, 90d supply, fill #1

## 2021-01-13 ENCOUNTER — Other Ambulatory Visit (HOSPITAL_COMMUNITY): Payer: Self-pay

## 2021-01-14 ENCOUNTER — Other Ambulatory Visit (HOSPITAL_COMMUNITY): Payer: Self-pay

## 2021-01-14 MED FILL — Rosuvastatin Calcium Tab 40 MG: ORAL | 30 days supply | Qty: 30 | Fill #2 | Status: AC

## 2021-01-15 ENCOUNTER — Other Ambulatory Visit (HOSPITAL_COMMUNITY): Payer: Self-pay

## 2021-01-15 MED ORDER — TESTOSTERONE CYPIONATE 200 MG/ML IM SOLN
INTRAMUSCULAR | 1 refills | Status: DC
  Filled 2021-01-15 (×2): qty 6, 84d supply, fill #0
  Filled 2021-04-04: qty 6, 84d supply, fill #1

## 2021-01-27 ENCOUNTER — Other Ambulatory Visit (HOSPITAL_COMMUNITY): Payer: Self-pay

## 2021-01-27 MED FILL — Metoprolol Tartrate Tab 25 MG: ORAL | 90 days supply | Qty: 180 | Fill #0 | Status: AC

## 2021-01-28 ENCOUNTER — Other Ambulatory Visit (HOSPITAL_COMMUNITY): Payer: Self-pay

## 2021-01-28 MED ORDER — FENOFIBRATE 160 MG PO TABS
ORAL_TABLET | ORAL | 1 refills | Status: DC
  Filled 2021-01-28: qty 90, 90d supply, fill #1
  Filled 2021-01-28: qty 90, 90d supply, fill #0
  Filled 2021-04-30: qty 90, 90d supply, fill #1

## 2021-01-28 MED ORDER — LOSARTAN POTASSIUM 100 MG PO TABS
ORAL_TABLET | ORAL | 1 refills | Status: DC
  Filled 2021-01-28: qty 90, 90d supply, fill #0
  Filled 2021-04-21: qty 90, 90d supply, fill #1

## 2021-01-29 ENCOUNTER — Other Ambulatory Visit (HOSPITAL_COMMUNITY): Payer: Self-pay

## 2021-02-05 DIAGNOSIS — H524 Presbyopia: Secondary | ICD-10-CM | POA: Diagnosis not present

## 2021-02-07 MED FILL — Metformin HCl Tab 500 MG: ORAL | 30 days supply | Qty: 60 | Fill #3 | Status: AC

## 2021-02-08 ENCOUNTER — Other Ambulatory Visit (HOSPITAL_COMMUNITY): Payer: Self-pay

## 2021-02-08 MED FILL — Needle (Disp) 18 x 1-1/2": 84 days supply | Qty: 6 | Fill #0 | Status: AC

## 2021-02-09 ENCOUNTER — Other Ambulatory Visit (HOSPITAL_COMMUNITY): Payer: Self-pay

## 2021-02-09 MED ORDER — "BD LUER-LOK SYRINGE 22G X 1-1/2"" 3 ML MISC"
0 refills | Status: DC
  Filled 2021-02-09: qty 6, 84d supply, fill #0

## 2021-02-14 MED FILL — Rosuvastatin Calcium Tab 40 MG: ORAL | 30 days supply | Qty: 30 | Fill #3 | Status: AC

## 2021-02-15 ENCOUNTER — Other Ambulatory Visit (HOSPITAL_COMMUNITY): Payer: Self-pay

## 2021-02-26 ENCOUNTER — Other Ambulatory Visit (HOSPITAL_COMMUNITY): Payer: Self-pay

## 2021-02-26 MED FILL — Metformin HCl Tab 500 MG: ORAL | 30 days supply | Qty: 60 | Fill #4 | Status: CN

## 2021-02-28 MED FILL — Glipizide Tab ER 24HR 10 MG: ORAL | 90 days supply | Qty: 180 | Fill #1 | Status: AC

## 2021-03-01 ENCOUNTER — Other Ambulatory Visit (HOSPITAL_COMMUNITY): Payer: Self-pay

## 2021-03-05 DIAGNOSIS — E7849 Other hyperlipidemia: Secondary | ICD-10-CM | POA: Diagnosis not present

## 2021-03-05 DIAGNOSIS — E1165 Type 2 diabetes mellitus with hyperglycemia: Secondary | ICD-10-CM | POA: Diagnosis not present

## 2021-03-05 DIAGNOSIS — E349 Endocrine disorder, unspecified: Secondary | ICD-10-CM | POA: Diagnosis not present

## 2021-03-14 MED FILL — Metformin HCl Tab 500 MG: ORAL | 30 days supply | Qty: 60 | Fill #4 | Status: AC

## 2021-03-15 ENCOUNTER — Other Ambulatory Visit (HOSPITAL_COMMUNITY): Payer: Self-pay

## 2021-03-16 ENCOUNTER — Other Ambulatory Visit (HOSPITAL_COMMUNITY): Payer: Self-pay

## 2021-03-16 DIAGNOSIS — E7849 Other hyperlipidemia: Secondary | ICD-10-CM | POA: Diagnosis not present

## 2021-03-16 DIAGNOSIS — I10 Essential (primary) hypertension: Secondary | ICD-10-CM | POA: Diagnosis not present

## 2021-03-16 DIAGNOSIS — G4733 Obstructive sleep apnea (adult) (pediatric): Secondary | ICD-10-CM | POA: Diagnosis not present

## 2021-03-16 DIAGNOSIS — E1165 Type 2 diabetes mellitus with hyperglycemia: Secondary | ICD-10-CM | POA: Diagnosis not present

## 2021-03-16 MED FILL — Rosuvastatin Calcium Tab 40 MG: ORAL | 30 days supply | Qty: 30 | Fill #4 | Status: AC

## 2021-03-28 MED FILL — Empagliflozin Tab 25 MG: ORAL | 30 days supply | Qty: 30 | Fill #3 | Status: AC

## 2021-03-30 ENCOUNTER — Other Ambulatory Visit (HOSPITAL_COMMUNITY): Payer: Self-pay

## 2021-04-02 ENCOUNTER — Other Ambulatory Visit (HOSPITAL_COMMUNITY): Payer: Self-pay

## 2021-04-02 DIAGNOSIS — D2261 Melanocytic nevi of right upper limb, including shoulder: Secondary | ICD-10-CM | POA: Diagnosis not present

## 2021-04-02 DIAGNOSIS — D2272 Melanocytic nevi of left lower limb, including hip: Secondary | ICD-10-CM | POA: Diagnosis not present

## 2021-04-02 DIAGNOSIS — B360 Pityriasis versicolor: Secondary | ICD-10-CM | POA: Diagnosis not present

## 2021-04-02 DIAGNOSIS — D1801 Hemangioma of skin and subcutaneous tissue: Secondary | ICD-10-CM | POA: Diagnosis not present

## 2021-04-02 DIAGNOSIS — L280 Lichen simplex chronicus: Secondary | ICD-10-CM | POA: Diagnosis not present

## 2021-04-02 DIAGNOSIS — D2262 Melanocytic nevi of left upper limb, including shoulder: Secondary | ICD-10-CM | POA: Diagnosis not present

## 2021-04-02 DIAGNOSIS — D225 Melanocytic nevi of trunk: Secondary | ICD-10-CM | POA: Diagnosis not present

## 2021-04-02 DIAGNOSIS — D2271 Melanocytic nevi of right lower limb, including hip: Secondary | ICD-10-CM | POA: Diagnosis not present

## 2021-04-02 DIAGNOSIS — L538 Other specified erythematous conditions: Secondary | ICD-10-CM | POA: Diagnosis not present

## 2021-04-02 DIAGNOSIS — L720 Epidermal cyst: Secondary | ICD-10-CM | POA: Diagnosis not present

## 2021-04-02 MED ORDER — KETOCONAZOLE 2 % EX CREA
TOPICAL_CREAM | CUTANEOUS | 1 refills | Status: DC
  Filled 2021-04-02: qty 60, 14d supply, fill #0

## 2021-04-05 ENCOUNTER — Other Ambulatory Visit (HOSPITAL_COMMUNITY): Payer: Self-pay

## 2021-04-10 MED FILL — Metformin HCl Tab 500 MG: ORAL | 30 days supply | Qty: 60 | Fill #5 | Status: AC

## 2021-04-12 ENCOUNTER — Other Ambulatory Visit (HOSPITAL_COMMUNITY): Payer: Self-pay

## 2021-04-21 ENCOUNTER — Other Ambulatory Visit (HOSPITAL_COMMUNITY): Payer: Self-pay

## 2021-04-21 MED FILL — Empagliflozin Tab 25 MG: ORAL | 30 days supply | Qty: 30 | Fill #4 | Status: AC

## 2021-04-22 ENCOUNTER — Other Ambulatory Visit (HOSPITAL_COMMUNITY): Payer: Self-pay

## 2021-04-22 ENCOUNTER — Other Ambulatory Visit: Payer: Self-pay

## 2021-04-22 MED ORDER — ROSUVASTATIN CALCIUM 40 MG PO TABS
ORAL_TABLET | ORAL | 3 refills | Status: DC
  Filled 2021-04-22: qty 90, 90d supply, fill #0
  Filled 2021-07-18: qty 90, 90d supply, fill #1
  Filled 2021-10-17: qty 90, 90d supply, fill #2
  Filled 2022-01-11: qty 90, 90d supply, fill #3

## 2021-04-22 MED ORDER — CARESTART COVID-19 HOME TEST VI KIT
PACK | 0 refills | Status: DC
  Filled 2021-04-22: qty 2, 4d supply, fill #0

## 2021-04-23 ENCOUNTER — Other Ambulatory Visit (HOSPITAL_COMMUNITY): Payer: Self-pay

## 2021-04-26 ENCOUNTER — Other Ambulatory Visit (HOSPITAL_COMMUNITY): Payer: Self-pay

## 2021-04-28 ENCOUNTER — Other Ambulatory Visit (HOSPITAL_COMMUNITY): Payer: Self-pay

## 2021-04-28 MED ORDER — CLONAZEPAM 0.5 MG PO TABS
0.5000 mg | ORAL_TABLET | Freq: Two times a day (BID) | ORAL | 1 refills | Status: DC | PRN
  Filled 2021-04-28: qty 180, 90d supply, fill #0
  Filled 2021-07-25: qty 180, 90d supply, fill #1

## 2021-04-29 ENCOUNTER — Other Ambulatory Visit (HOSPITAL_COMMUNITY): Payer: Self-pay

## 2021-05-01 ENCOUNTER — Other Ambulatory Visit (HOSPITAL_COMMUNITY): Payer: Self-pay

## 2021-05-02 ENCOUNTER — Other Ambulatory Visit (HOSPITAL_COMMUNITY): Payer: Self-pay

## 2021-05-02 MED FILL — Metoprolol Tartrate Tab 25 MG: ORAL | 90 days supply | Qty: 180 | Fill #1 | Status: AC

## 2021-05-02 MED FILL — Metformin HCl Tab 500 MG: ORAL | 30 days supply | Qty: 60 | Fill #6 | Status: AC

## 2021-05-03 ENCOUNTER — Other Ambulatory Visit (HOSPITAL_COMMUNITY): Payer: Self-pay

## 2021-05-03 MED ORDER — FENOFIBRATE 160 MG PO TABS
ORAL_TABLET | ORAL | 1 refills | Status: DC
  Filled 2021-05-03 – 2021-10-22 (×2): qty 90, 90d supply, fill #0

## 2021-05-04 ENCOUNTER — Other Ambulatory Visit (HOSPITAL_COMMUNITY): Payer: Self-pay

## 2021-05-28 ENCOUNTER — Other Ambulatory Visit (HOSPITAL_COMMUNITY): Payer: Self-pay

## 2021-05-30 MED FILL — Glipizide Tab ER 24HR 10 MG: ORAL | 90 days supply | Qty: 90 | Fill #0 | Status: AC

## 2021-05-31 ENCOUNTER — Other Ambulatory Visit (HOSPITAL_COMMUNITY): Payer: Self-pay

## 2021-06-01 ENCOUNTER — Other Ambulatory Visit (HOSPITAL_COMMUNITY): Payer: Self-pay

## 2021-06-01 MED ORDER — ESOMEPRAZOLE MAGNESIUM 40 MG PO CPDR
DELAYED_RELEASE_CAPSULE | ORAL | 5 refills | Status: DC
  Filled 2021-06-01: qty 60, 30d supply, fill #0
  Filled 2021-06-25: qty 60, 30d supply, fill #1

## 2021-06-02 ENCOUNTER — Other Ambulatory Visit (HOSPITAL_COMMUNITY): Payer: Self-pay

## 2021-06-02 MED ORDER — JARDIANCE 25 MG PO TABS
25.0000 mg | ORAL_TABLET | Freq: Every day | ORAL | 3 refills | Status: DC
  Filled 2021-06-02: qty 90, 90d supply, fill #0
  Filled 2021-08-19: qty 90, 90d supply, fill #1
  Filled 2021-11-13: qty 90, 90d supply, fill #2
  Filled 2022-02-15: qty 90, 90d supply, fill #3

## 2021-06-03 ENCOUNTER — Other Ambulatory Visit (HOSPITAL_COMMUNITY): Payer: Self-pay

## 2021-06-05 MED FILL — Metformin HCl Tab 500 MG: ORAL | 30 days supply | Qty: 60 | Fill #7 | Status: AC

## 2021-06-06 ENCOUNTER — Other Ambulatory Visit (HOSPITAL_COMMUNITY): Payer: Self-pay

## 2021-06-07 ENCOUNTER — Other Ambulatory Visit (HOSPITAL_COMMUNITY): Payer: Self-pay

## 2021-06-25 ENCOUNTER — Other Ambulatory Visit (HOSPITAL_COMMUNITY): Payer: Self-pay

## 2021-07-06 ENCOUNTER — Other Ambulatory Visit (HOSPITAL_COMMUNITY): Payer: Self-pay

## 2021-07-06 MED ORDER — DULOXETINE HCL 60 MG PO CPEP
ORAL_CAPSULE | ORAL | 1 refills | Status: DC
  Filled 2021-07-06: qty 90, 90d supply, fill #0
  Filled 2021-10-09: qty 90, 90d supply, fill #1

## 2021-07-09 ENCOUNTER — Other Ambulatory Visit (HOSPITAL_COMMUNITY): Payer: Self-pay

## 2021-07-09 MED FILL — Metformin HCl Tab 500 MG: ORAL | 30 days supply | Qty: 60 | Fill #8 | Status: AC

## 2021-07-10 MED FILL — Glipizide Tab ER 24HR 10 MG: ORAL | 90 days supply | Qty: 180 | Fill #2 | Status: CN

## 2021-07-12 ENCOUNTER — Other Ambulatory Visit (HOSPITAL_COMMUNITY): Payer: Self-pay

## 2021-07-13 ENCOUNTER — Other Ambulatory Visit (HOSPITAL_COMMUNITY): Payer: Self-pay

## 2021-07-13 MED ORDER — TESTOSTERONE CYPIONATE 200 MG/ML IM SOLN
INTRAMUSCULAR | 1 refills | Status: DC
  Filled 2021-07-13: qty 6, 84d supply, fill #0
  Filled 2021-10-03: qty 6, 84d supply, fill #1

## 2021-07-14 ENCOUNTER — Other Ambulatory Visit (HOSPITAL_COMMUNITY): Payer: Self-pay

## 2021-07-14 MED FILL — Glipizide Tab ER 24HR 10 MG: ORAL | 90 days supply | Qty: 180 | Fill #2 | Status: CN

## 2021-07-15 ENCOUNTER — Other Ambulatory Visit: Payer: Self-pay

## 2021-07-15 ENCOUNTER — Other Ambulatory Visit (HOSPITAL_COMMUNITY): Payer: Self-pay

## 2021-07-15 MED FILL — Glipizide Tab ER 24HR 10 MG: ORAL | 90 days supply | Qty: 180 | Fill #2 | Status: CN

## 2021-07-15 MED FILL — Glipizide Tab ER 24HR 10 MG: ORAL | 90 days supply | Qty: 180 | Fill #2 | Status: AC

## 2021-07-16 ENCOUNTER — Other Ambulatory Visit (HOSPITAL_COMMUNITY): Payer: Self-pay

## 2021-07-16 MED ORDER — ESOMEPRAZOLE MAGNESIUM 40 MG PO CPDR
DELAYED_RELEASE_CAPSULE | ORAL | 1 refills | Status: DC
  Filled 2021-07-16: qty 180, 90d supply, fill #0
  Filled 2021-11-22: qty 180, 90d supply, fill #1

## 2021-07-16 MED ORDER — FENOFIBRATE 160 MG PO TABS
ORAL_TABLET | ORAL | 1 refills | Status: DC
  Filled 2021-07-16: qty 90, 90d supply, fill #0

## 2021-07-20 ENCOUNTER — Other Ambulatory Visit (HOSPITAL_COMMUNITY): Payer: Self-pay

## 2021-07-21 ENCOUNTER — Other Ambulatory Visit (HOSPITAL_COMMUNITY): Payer: Self-pay

## 2021-07-23 ENCOUNTER — Other Ambulatory Visit (HOSPITAL_COMMUNITY): Payer: Self-pay

## 2021-07-25 ENCOUNTER — Other Ambulatory Visit (HOSPITAL_COMMUNITY): Payer: Self-pay

## 2021-07-25 MED FILL — Metoprolol Tartrate Tab 25 MG: ORAL | 90 days supply | Qty: 180 | Fill #2 | Status: AC

## 2021-07-26 ENCOUNTER — Other Ambulatory Visit (HOSPITAL_COMMUNITY): Payer: Self-pay

## 2021-07-28 MED FILL — Losartan Potassium Tab 100 MG: ORAL | 90 days supply | Qty: 90 | Fill #1 | Status: AC

## 2021-07-29 ENCOUNTER — Other Ambulatory Visit (HOSPITAL_COMMUNITY): Payer: Self-pay

## 2021-07-29 MED ORDER — LOSARTAN POTASSIUM 100 MG PO TABS
ORAL_TABLET | ORAL | 1 refills | Status: DC
  Filled 2021-07-29 – 2021-10-27 (×2): qty 90, 90d supply, fill #0
  Filled 2022-04-18: qty 90, 90d supply, fill #1

## 2021-08-04 ENCOUNTER — Other Ambulatory Visit (HOSPITAL_COMMUNITY): Payer: Self-pay

## 2021-08-05 ENCOUNTER — Other Ambulatory Visit (HOSPITAL_COMMUNITY): Payer: Self-pay

## 2021-08-05 MED ORDER — "EASY TOUCH HYPODERMIC NEEDLE 18G X 1-1/2"" MISC"
1 refills | Status: DC
  Filled 2021-08-05: qty 6, 90d supply, fill #0
  Filled 2021-10-31: qty 6, 90d supply, fill #1

## 2021-08-08 MED FILL — Metformin HCl Tab 500 MG: ORAL | 30 days supply | Qty: 60 | Fill #9 | Status: AC

## 2021-08-09 ENCOUNTER — Other Ambulatory Visit (HOSPITAL_COMMUNITY): Payer: Self-pay

## 2021-08-19 ENCOUNTER — Other Ambulatory Visit (HOSPITAL_COMMUNITY): Payer: Self-pay

## 2021-08-22 ENCOUNTER — Other Ambulatory Visit (HOSPITAL_COMMUNITY): Payer: Self-pay

## 2021-08-23 ENCOUNTER — Other Ambulatory Visit (HOSPITAL_COMMUNITY): Payer: Self-pay

## 2021-08-23 MED ORDER — "BD LUER-LOK SYRINGE 22G X 1-1/2"" 3 ML MISC"
1 refills | Status: DC
  Filled 2021-08-23: qty 6, 84d supply, fill #0
  Filled 2021-10-31: qty 6, 84d supply, fill #1

## 2021-08-24 ENCOUNTER — Other Ambulatory Visit (HOSPITAL_COMMUNITY): Payer: Self-pay

## 2021-09-09 ENCOUNTER — Other Ambulatory Visit (HOSPITAL_COMMUNITY): Payer: Self-pay

## 2021-09-09 MED FILL — Metformin HCl Tab 500 MG: ORAL | 30 days supply | Qty: 60 | Fill #10 | Status: AC

## 2021-09-13 DIAGNOSIS — E7849 Other hyperlipidemia: Secondary | ICD-10-CM | POA: Diagnosis not present

## 2021-09-13 DIAGNOSIS — E1165 Type 2 diabetes mellitus with hyperglycemia: Secondary | ICD-10-CM | POA: Diagnosis not present

## 2021-09-13 DIAGNOSIS — E349 Endocrine disorder, unspecified: Secondary | ICD-10-CM | POA: Diagnosis not present

## 2021-09-20 ENCOUNTER — Other Ambulatory Visit (HOSPITAL_COMMUNITY): Payer: Self-pay

## 2021-09-20 DIAGNOSIS — E7849 Other hyperlipidemia: Secondary | ICD-10-CM | POA: Diagnosis not present

## 2021-09-20 DIAGNOSIS — G4733 Obstructive sleep apnea (adult) (pediatric): Secondary | ICD-10-CM | POA: Diagnosis not present

## 2021-09-20 DIAGNOSIS — I10 Essential (primary) hypertension: Secondary | ICD-10-CM | POA: Diagnosis not present

## 2021-09-20 DIAGNOSIS — Z0000 Encounter for general adult medical examination without abnormal findings: Secondary | ICD-10-CM | POA: Diagnosis not present

## 2021-09-20 MED ORDER — TADALAFIL 5 MG PO TABS
ORAL_TABLET | ORAL | 11 refills | Status: DC
  Filled 2021-09-20: qty 30, 30d supply, fill #0
  Filled 2021-10-17: qty 30, 30d supply, fill #1
  Filled 2021-11-15: qty 30, 30d supply, fill #2
  Filled 2021-12-23: qty 30, 30d supply, fill #3
  Filled 2022-01-15: qty 30, 30d supply, fill #4
  Filled 2022-02-15: qty 30, 30d supply, fill #5

## 2021-10-04 ENCOUNTER — Other Ambulatory Visit (HOSPITAL_COMMUNITY): Payer: Self-pay

## 2021-10-09 ENCOUNTER — Other Ambulatory Visit (HOSPITAL_COMMUNITY): Payer: Self-pay

## 2021-10-09 MED FILL — Metformin HCl Tab 500 MG: ORAL | 30 days supply | Qty: 60 | Fill #11 | Status: AC

## 2021-10-12 ENCOUNTER — Other Ambulatory Visit (HOSPITAL_COMMUNITY): Payer: Self-pay

## 2021-10-13 ENCOUNTER — Other Ambulatory Visit (HOSPITAL_COMMUNITY): Payer: Self-pay

## 2021-10-13 MED ORDER — GLIPIZIDE ER 10 MG PO TB24
20.0000 mg | ORAL_TABLET | Freq: Every day | ORAL | 3 refills | Status: DC
  Filled 2021-10-13: qty 180, 90d supply, fill #0
  Filled 2022-01-06: qty 180, 90d supply, fill #1

## 2021-10-17 ENCOUNTER — Other Ambulatory Visit (HOSPITAL_COMMUNITY): Payer: Self-pay

## 2021-10-18 ENCOUNTER — Other Ambulatory Visit (HOSPITAL_COMMUNITY): Payer: Self-pay

## 2021-10-22 ENCOUNTER — Other Ambulatory Visit (HOSPITAL_COMMUNITY): Payer: Self-pay

## 2021-10-25 ENCOUNTER — Other Ambulatory Visit (HOSPITAL_COMMUNITY): Payer: Self-pay

## 2021-10-25 MED ORDER — CLONAZEPAM 0.5 MG PO TABS
0.5000 mg | ORAL_TABLET | Freq: Two times a day (BID) | ORAL | 1 refills | Status: DC | PRN
  Filled 2021-10-25: qty 180, 90d supply, fill #0
  Filled 2022-01-24: qty 180, 90d supply, fill #1

## 2021-10-26 ENCOUNTER — Other Ambulatory Visit (HOSPITAL_COMMUNITY): Payer: Self-pay

## 2021-10-27 ENCOUNTER — Other Ambulatory Visit (HOSPITAL_COMMUNITY): Payer: Self-pay

## 2021-10-28 ENCOUNTER — Other Ambulatory Visit (HOSPITAL_COMMUNITY): Payer: Self-pay

## 2021-10-28 MED ORDER — METFORMIN HCL 500 MG PO TABS
ORAL_TABLET | ORAL | 3 refills | Status: DC
  Filled 2021-10-28: qty 180, 90d supply, fill #0
  Filled 2022-02-04: qty 180, 90d supply, fill #1
  Filled 2022-05-03: qty 180, 90d supply, fill #2
  Filled 2022-08-03: qty 180, 90d supply, fill #3

## 2021-10-28 MED ORDER — METOPROLOL TARTRATE 25 MG PO TABS
25.0000 mg | ORAL_TABLET | Freq: Two times a day (BID) | ORAL | 3 refills | Status: DC
  Filled 2021-10-28: qty 180, 90d supply, fill #0

## 2021-10-29 ENCOUNTER — Other Ambulatory Visit (HOSPITAL_COMMUNITY): Payer: Self-pay

## 2021-10-31 ENCOUNTER — Other Ambulatory Visit (HOSPITAL_COMMUNITY): Payer: Self-pay

## 2021-11-01 ENCOUNTER — Other Ambulatory Visit (HOSPITAL_COMMUNITY): Payer: Self-pay

## 2021-11-01 MED ORDER — "NEEDLE (DISP) 18G X 1-1/2"" MISC"
0 refills | Status: DC
  Filled 2021-11-01: qty 6, 30d supply, fill #0

## 2021-11-02 ENCOUNTER — Other Ambulatory Visit (HOSPITAL_COMMUNITY): Payer: Self-pay

## 2021-11-13 ENCOUNTER — Other Ambulatory Visit (HOSPITAL_COMMUNITY): Payer: Self-pay

## 2021-11-15 ENCOUNTER — Other Ambulatory Visit (HOSPITAL_COMMUNITY): Payer: Self-pay

## 2021-11-22 ENCOUNTER — Other Ambulatory Visit (HOSPITAL_COMMUNITY): Payer: Self-pay

## 2021-12-10 DIAGNOSIS — E7849 Other hyperlipidemia: Secondary | ICD-10-CM | POA: Diagnosis not present

## 2021-12-10 DIAGNOSIS — E1165 Type 2 diabetes mellitus with hyperglycemia: Secondary | ICD-10-CM | POA: Diagnosis not present

## 2021-12-17 DIAGNOSIS — I10 Essential (primary) hypertension: Secondary | ICD-10-CM | POA: Diagnosis not present

## 2021-12-17 DIAGNOSIS — F418 Other specified anxiety disorders: Secondary | ICD-10-CM | POA: Diagnosis not present

## 2021-12-17 DIAGNOSIS — E1165 Type 2 diabetes mellitus with hyperglycemia: Secondary | ICD-10-CM | POA: Diagnosis not present

## 2021-12-17 DIAGNOSIS — R1011 Right upper quadrant pain: Secondary | ICD-10-CM | POA: Diagnosis not present

## 2021-12-17 DIAGNOSIS — D1802 Hemangioma of intracranial structures: Secondary | ICD-10-CM | POA: Diagnosis not present

## 2021-12-17 DIAGNOSIS — E7849 Other hyperlipidemia: Secondary | ICD-10-CM | POA: Diagnosis not present

## 2021-12-17 DIAGNOSIS — G4733 Obstructive sleep apnea (adult) (pediatric): Secondary | ICD-10-CM | POA: Diagnosis not present

## 2021-12-17 DIAGNOSIS — K219 Gastro-esophageal reflux disease without esophagitis: Secondary | ICD-10-CM | POA: Diagnosis not present

## 2021-12-17 DIAGNOSIS — E349 Endocrine disorder, unspecified: Secondary | ICD-10-CM | POA: Diagnosis not present

## 2021-12-22 ENCOUNTER — Other Ambulatory Visit (HOSPITAL_COMMUNITY): Payer: Self-pay

## 2021-12-22 MED ORDER — HYDROCORTISONE (PERIANAL) 2.5 % EX CREA
TOPICAL_CREAM | CUTANEOUS | 1 refills | Status: DC
  Filled 2021-12-22: qty 30, 10d supply, fill #0

## 2021-12-23 ENCOUNTER — Other Ambulatory Visit (HOSPITAL_COMMUNITY): Payer: Self-pay

## 2021-12-25 ENCOUNTER — Other Ambulatory Visit (HOSPITAL_COMMUNITY): Payer: Self-pay

## 2021-12-29 ENCOUNTER — Other Ambulatory Visit (HOSPITAL_COMMUNITY): Payer: Self-pay

## 2021-12-29 MED ORDER — TESTOSTERONE CYPIONATE 200 MG/ML IM SOLN
INTRAMUSCULAR | 1 refills | Status: DC
  Filled 2021-12-29: qty 6, 84d supply, fill #0
  Filled 2022-03-20: qty 6, 84d supply, fill #1

## 2022-01-04 ENCOUNTER — Other Ambulatory Visit (HOSPITAL_COMMUNITY): Payer: Self-pay

## 2022-01-04 MED ORDER — DULOXETINE HCL 60 MG PO CPEP
60.0000 mg | ORAL_CAPSULE | Freq: Every day | ORAL | 1 refills | Status: DC
  Filled 2022-01-04: qty 90, 90d supply, fill #0

## 2022-01-06 ENCOUNTER — Other Ambulatory Visit (HOSPITAL_COMMUNITY): Payer: Self-pay

## 2022-01-10 DIAGNOSIS — K641 Second degree hemorrhoids: Secondary | ICD-10-CM | POA: Diagnosis not present

## 2022-01-11 ENCOUNTER — Other Ambulatory Visit (HOSPITAL_COMMUNITY): Payer: Self-pay

## 2022-01-15 ENCOUNTER — Other Ambulatory Visit (HOSPITAL_COMMUNITY): Payer: Self-pay

## 2022-01-21 ENCOUNTER — Other Ambulatory Visit (HOSPITAL_COMMUNITY): Payer: Self-pay

## 2022-01-21 MED ORDER — METOPROLOL TARTRATE 25 MG PO TABS
25.0000 mg | ORAL_TABLET | Freq: Two times a day (BID) | ORAL | 3 refills | Status: DC
Start: 2022-01-21 — End: 2023-01-16
  Filled 2022-01-21: qty 180, 90d supply, fill #0
  Filled 2022-04-22: qty 180, 90d supply, fill #1
  Filled 2022-07-23: qty 180, 90d supply, fill #2
  Filled 2022-10-20: qty 180, 90d supply, fill #3
  Filled 2023-01-15: qty 180, 90d supply, fill #4

## 2022-01-21 MED ORDER — LOSARTAN POTASSIUM 100 MG PO TABS
ORAL_TABLET | ORAL | 1 refills | Status: DC
  Filled 2022-01-21: qty 90, 90d supply, fill #0

## 2022-01-21 MED ORDER — FENOFIBRATE 160 MG PO TABS
ORAL_TABLET | ORAL | 1 refills | Status: DC
  Filled 2022-01-21: qty 90, 90d supply, fill #0
  Filled 2022-04-28: qty 90, 90d supply, fill #1

## 2022-01-23 ENCOUNTER — Other Ambulatory Visit (HOSPITAL_COMMUNITY): Payer: Self-pay

## 2022-01-24 ENCOUNTER — Other Ambulatory Visit (HOSPITAL_COMMUNITY): Payer: Self-pay

## 2022-01-24 MED ORDER — "BD LUER-LOK SYRINGE 22G X 1-1/2"" 3 ML MISC"
1 refills | Status: DC
  Filled 2022-01-24: qty 6, 30d supply, fill #0
  Filled 2022-02-07: qty 6, 84d supply, fill #0
  Filled 2022-04-30 – 2022-07-26 (×4): qty 6, 84d supply, fill #1

## 2022-02-01 ENCOUNTER — Other Ambulatory Visit (HOSPITAL_COMMUNITY): Payer: Self-pay

## 2022-02-02 ENCOUNTER — Other Ambulatory Visit (HOSPITAL_COMMUNITY): Payer: Self-pay

## 2022-02-04 ENCOUNTER — Other Ambulatory Visit (HOSPITAL_COMMUNITY): Payer: Self-pay

## 2022-02-07 ENCOUNTER — Other Ambulatory Visit (HOSPITAL_COMMUNITY): Payer: Self-pay

## 2022-02-10 ENCOUNTER — Other Ambulatory Visit (HOSPITAL_COMMUNITY): Payer: Self-pay

## 2022-02-15 ENCOUNTER — Other Ambulatory Visit (HOSPITAL_COMMUNITY): Payer: Self-pay

## 2022-02-15 MED ORDER — "EASY TOUCH HYPODERMIC NEEDLE 18G X 1-1/2"" MISC"
1 refills | Status: DC
  Filled 2022-02-15: qty 6, 84d supply, fill #0
  Filled 2022-04-30: qty 6, 84d supply, fill #1

## 2022-02-16 ENCOUNTER — Other Ambulatory Visit (HOSPITAL_COMMUNITY): Payer: Self-pay

## 2022-02-18 ENCOUNTER — Other Ambulatory Visit (HOSPITAL_COMMUNITY): Payer: Self-pay

## 2022-02-18 MED ORDER — ESOMEPRAZOLE MAGNESIUM 40 MG PO CPDR
DELAYED_RELEASE_CAPSULE | ORAL | 1 refills | Status: DC
  Filled 2022-02-18: qty 180, 90d supply, fill #0
  Filled 2022-05-13: qty 180, 90d supply, fill #1

## 2022-03-16 DIAGNOSIS — E7849 Other hyperlipidemia: Secondary | ICD-10-CM | POA: Diagnosis not present

## 2022-03-16 DIAGNOSIS — E349 Endocrine disorder, unspecified: Secondary | ICD-10-CM | POA: Diagnosis not present

## 2022-03-16 DIAGNOSIS — K219 Gastro-esophageal reflux disease without esophagitis: Secondary | ICD-10-CM | POA: Diagnosis not present

## 2022-03-16 DIAGNOSIS — E1165 Type 2 diabetes mellitus with hyperglycemia: Secondary | ICD-10-CM | POA: Diagnosis not present

## 2022-03-21 ENCOUNTER — Other Ambulatory Visit (HOSPITAL_COMMUNITY): Payer: Self-pay

## 2022-03-22 ENCOUNTER — Other Ambulatory Visit (HOSPITAL_COMMUNITY): Payer: Self-pay

## 2022-03-22 MED ORDER — DULOXETINE HCL 60 MG PO CPEP
60.0000 mg | ORAL_CAPSULE | Freq: Every day | ORAL | 1 refills | Status: DC
  Filled 2022-03-22: qty 90, 90d supply, fill #0
  Filled 2022-07-02: qty 90, 90d supply, fill #1

## 2022-03-23 DIAGNOSIS — K219 Gastro-esophageal reflux disease without esophagitis: Secondary | ICD-10-CM | POA: Diagnosis not present

## 2022-03-23 DIAGNOSIS — Z125 Encounter for screening for malignant neoplasm of prostate: Secondary | ICD-10-CM | POA: Diagnosis not present

## 2022-03-23 DIAGNOSIS — E7849 Other hyperlipidemia: Secondary | ICD-10-CM | POA: Diagnosis not present

## 2022-03-23 DIAGNOSIS — E349 Endocrine disorder, unspecified: Secondary | ICD-10-CM | POA: Diagnosis not present

## 2022-03-23 DIAGNOSIS — D1802 Hemangioma of intracranial structures: Secondary | ICD-10-CM | POA: Diagnosis not present

## 2022-03-23 DIAGNOSIS — I10 Essential (primary) hypertension: Secondary | ICD-10-CM | POA: Diagnosis not present

## 2022-03-23 DIAGNOSIS — G4733 Obstructive sleep apnea (adult) (pediatric): Secondary | ICD-10-CM | POA: Diagnosis not present

## 2022-03-23 DIAGNOSIS — R1011 Right upper quadrant pain: Secondary | ICD-10-CM | POA: Diagnosis not present

## 2022-03-23 DIAGNOSIS — E1165 Type 2 diabetes mellitus with hyperglycemia: Secondary | ICD-10-CM | POA: Diagnosis not present

## 2022-03-29 ENCOUNTER — Ambulatory Visit: Payer: 59 | Admitting: Urology

## 2022-03-29 ENCOUNTER — Other Ambulatory Visit: Payer: Self-pay | Admitting: *Deleted

## 2022-03-29 ENCOUNTER — Other Ambulatory Visit
Admission: RE | Admit: 2022-03-29 | Discharge: 2022-03-29 | Disposition: A | Discharge status: Home / Self Care | Payer: 59 | Attending: Urology | Admitting: Urology

## 2022-03-29 ENCOUNTER — Other Ambulatory Visit (HOSPITAL_COMMUNITY): Payer: Self-pay

## 2022-03-29 ENCOUNTER — Encounter: Payer: Self-pay | Admitting: Urology

## 2022-03-29 VITALS — BP 120/71 | HR 84 | Ht 70.5 in | Wt 227.4 lb

## 2022-03-29 DIAGNOSIS — R399 Unspecified symptoms and signs involving the genitourinary system: Secondary | ICD-10-CM

## 2022-03-29 DIAGNOSIS — N529 Male erectile dysfunction, unspecified: Secondary | ICD-10-CM | POA: Diagnosis not present

## 2022-03-29 DIAGNOSIS — R3911 Hesitancy of micturition: Secondary | ICD-10-CM | POA: Diagnosis not present

## 2022-03-29 DIAGNOSIS — N400 Enlarged prostate without lower urinary tract symptoms: Secondary | ICD-10-CM | POA: Diagnosis not present

## 2022-03-29 DIAGNOSIS — N401 Enlarged prostate with lower urinary tract symptoms: Secondary | ICD-10-CM

## 2022-03-29 LAB — URINALYSIS, COMPLETE (UACMP) WITH MICROSCOPIC
Bilirubin Urine: NEGATIVE
Glucose, UA: >1000 mg/dL — AB
Hgb urine dipstick: NEGATIVE
Ketones, ur: NEGATIVE mg/dL
Leukocytes,Ua: NEGATIVE
Nitrite: NEGATIVE
Protein, ur: NEGATIVE mg/dL
Specific Gravity, Urine: 1.01 (ref 1.005–1.030)
pH: 7 (ref 5.0–8.0)

## 2022-03-29 LAB — BLADDER SCAN AMB NON-IMAGING

## 2022-03-29 MED ORDER — SILDENAFIL CITRATE 100 MG PO TABS
100.0000 mg | ORAL_TABLET | Freq: Every day | ORAL | 0 refills | Status: DC | PRN
  Filled 2022-03-29: qty 30, 30d supply, fill #0

## 2022-03-29 MED ORDER — TAMSULOSIN HCL 0.4 MG PO CAPS
0.4000 mg | ORAL_CAPSULE | Freq: Every day | ORAL | 11 refills | Status: DC
  Filled 2022-03-29: qty 30, 30d supply, fill #0
  Filled 2022-04-24: qty 30, 30d supply, fill #1
  Filled 2022-05-22: qty 30, 30d supply, fill #2
  Filled 2022-06-23: qty 30, 30d supply, fill #3
  Filled 2022-07-21 – 2022-07-22 (×2): qty 30, 30d supply, fill #4
  Filled 2022-08-24: qty 30, 30d supply, fill #5
  Filled 2022-09-21: qty 30, 30d supply, fill #6
  Filled 2022-10-20: qty 30, 30d supply, fill #7
  Filled 2022-11-21: qty 30, 30d supply, fill #8
  Filled 2022-12-17: qty 30, 30d supply, fill #9
  Filled 2023-01-15: qty 30, 30d supply, fill #10
  Filled 2023-02-15: qty 30, 30d supply, fill #11

## 2022-03-29 NOTE — Progress Notes (Signed)
   03/29/22 10:58 AM   Dustin Perkins 1976/02/26 [id number]  CC: Urinary symptoms, ED, low testosterone  HPI: 46 year old male who reports symptoms of erectile dysfunction since starting Cymbalta, some postvoid dribbling and weak stream, and has been on testosterone replacement via PCP for low testosterone long-term.  He reportedly has some issues with esophageal motility that is felt to be related to some sort of central nervous disorder, but I am unable to find any true diagnosis or neurology notes, and he is unsure if he was ever diagnosed with any particular disorder.  He thinks this could be related to his urinary symptoms as well.  He was recently tried on Cialis 5-'10mg'$  daily to see if he had improvement with both the erections and urinary symptoms.  He does think it helped with the urinary symptoms as well as the erections, but erections were still not sufficient for intercourse.  He did not have any problems with erections prior to trying the Cymbalta, and when he has come off that previously he gets normal erections, but has significant side effects off of the Cymbalta.  He denies any gross hematuria or dysuria.  Recent urinalysis benign aside from > 1000 glucose(on Jardiance), PVR today is normal at 30 mL.  PSA was normal at 0.52 in February 2023.   PMH: Past Medical History:  Diagnosis Date   GERD (gastroesophageal reflux disease)    Hyperlipidemia    Hypertension     Surgical History: Past Surgical History:  Procedure Laterality Date   COLONOSCOPY  2012   GALLBLADDER SURGERY  2012   UPPER GASTROINTESTINAL ENDOSCOPY  2012    Family History: Family History  Problem Relation Age of Onset   Heart disease Maternal Grandfather    Heart attack Maternal Grandfather     Social History:  reports that he has never smoked. He does not have any smokeless tobacco history on file. He reports that he does not drink alcohol and does not use drugs.  Physical Exam: BP 120/71 (BP  Location: Left Arm, Patient Position: Sitting, Cuff Size: Large)   Pulse 84   Ht 5' 10.5" (1.791 m)   Wt 227 lb 6.4 oz (103.1 kg)   BMI 32.17 kg/m    Constitutional:  Alert and oriented, No acute distress. Cardiovascular: No clubbing, cyanosis, or edema. Respiratory: Normal respiratory effort, no increased work of breathing. GI: Abdomen is soft, nontender, nondistended, no abdominal masses  Laboratory Data: Reviewed, see HPI  Assessment & Plan:   46 year old male with urinary symptoms of weak stream and postvoid dribbling as well as erectile dysfunction on Cymbalta.  He has good erections off the Cymbalta, but cannot come off this medication is as tried alternatives previously.  He had some mild improvement in symptoms on the Cialis daily previously.  We discussed possible etiologies of his urinary symptoms including related to his esophageal motility regarding some sort of central nervous system disorder, BPH, urethral stricture, and less likely malignancy.  I think it is reasonable to try Flomax 0.4 mg nightly as he has had some improvement on the Cialis in the urination previously, and a trial of Viagra 100 mg as needed for his ED.  He can stop the Cialis.  Risk, benefits, alternatives discussed  Trial of Flomax and sildenafil RTC 4 to 6 weeks symptom check and PVR  Nickolas Madrid, MD 03/29/2022  [facility] [address] [place], [place] [address] [phone]

## 2022-04-05 DIAGNOSIS — Z111 Encounter for screening for respiratory tuberculosis: Secondary | ICD-10-CM | POA: Diagnosis not present

## 2022-04-08 ENCOUNTER — Other Ambulatory Visit (HOSPITAL_COMMUNITY): Payer: Self-pay

## 2022-04-08 MED ORDER — GLIPIZIDE ER 10 MG PO TB24
20.0000 mg | ORAL_TABLET | Freq: Every day | ORAL | 3 refills | Status: DC
  Filled 2022-04-08: qty 180, 90d supply, fill #0

## 2022-04-08 MED ORDER — ROSUVASTATIN CALCIUM 40 MG PO TABS
40.0000 mg | ORAL_TABLET | Freq: Every day | ORAL | 3 refills | Status: DC
  Filled 2022-04-08: qty 90, 90d supply, fill #0
  Filled 2022-07-13: qty 90, 90d supply, fill #1
  Filled 2022-10-05: qty 90, 90d supply, fill #2
  Filled 2022-12-30: qty 90, 90d supply, fill #3
  Filled 2023-04-03: qty 90, 90d supply, fill #4

## 2022-04-09 ENCOUNTER — Other Ambulatory Visit (HOSPITAL_COMMUNITY): Payer: Self-pay

## 2022-04-13 ENCOUNTER — Other Ambulatory Visit (HOSPITAL_COMMUNITY): Payer: Self-pay

## 2022-04-13 DIAGNOSIS — Z23 Encounter for immunization: Secondary | ICD-10-CM | POA: Diagnosis not present

## 2022-04-18 ENCOUNTER — Other Ambulatory Visit (HOSPITAL_COMMUNITY): Payer: Self-pay

## 2022-04-19 ENCOUNTER — Other Ambulatory Visit (HOSPITAL_COMMUNITY): Payer: Self-pay

## 2022-04-19 MED ORDER — CLONAZEPAM 0.5 MG PO TABS
0.5000 mg | ORAL_TABLET | Freq: Two times a day (BID) | ORAL | 1 refills | Status: DC | PRN
  Filled 2022-04-22: qty 180, 90d supply, fill #0
  Filled 2022-07-23: qty 180, 90d supply, fill #1

## 2022-04-20 ENCOUNTER — Other Ambulatory Visit (HOSPITAL_COMMUNITY): Payer: Self-pay

## 2022-04-23 ENCOUNTER — Other Ambulatory Visit (HOSPITAL_COMMUNITY): Payer: Self-pay

## 2022-04-25 ENCOUNTER — Other Ambulatory Visit (HOSPITAL_COMMUNITY): Payer: Self-pay

## 2022-04-29 ENCOUNTER — Other Ambulatory Visit (HOSPITAL_COMMUNITY): Payer: Self-pay

## 2022-05-02 ENCOUNTER — Other Ambulatory Visit (HOSPITAL_COMMUNITY): Payer: Self-pay

## 2022-05-03 ENCOUNTER — Other Ambulatory Visit (HOSPITAL_COMMUNITY): Payer: Self-pay

## 2022-05-04 ENCOUNTER — Other Ambulatory Visit (HOSPITAL_COMMUNITY): Payer: Self-pay

## 2022-05-04 MED ORDER — JARDIANCE 25 MG PO TABS
25.0000 mg | ORAL_TABLET | Freq: Every day | ORAL | 3 refills | Status: DC
  Filled 2022-05-04 – 2022-05-06 (×2): qty 91, 91d supply, fill #0
  Filled 2022-05-06 – 2022-05-13 (×2): qty 90, 90d supply, fill #0
  Filled 2022-08-15: qty 90, 90d supply, fill #1
  Filled 2022-11-08: qty 90, 90d supply, fill #2
  Filled 2023-02-06: qty 90, 90d supply, fill #3

## 2022-05-05 ENCOUNTER — Other Ambulatory Visit (HOSPITAL_COMMUNITY): Payer: Self-pay

## 2022-05-05 DIAGNOSIS — Z86018 Personal history of other benign neoplasm: Secondary | ICD-10-CM | POA: Diagnosis not present

## 2022-05-05 DIAGNOSIS — L578 Other skin changes due to chronic exposure to nonionizing radiation: Secondary | ICD-10-CM | POA: Diagnosis not present

## 2022-05-05 DIAGNOSIS — L738 Other specified follicular disorders: Secondary | ICD-10-CM | POA: Diagnosis not present

## 2022-05-05 DIAGNOSIS — L720 Epidermal cyst: Secondary | ICD-10-CM | POA: Diagnosis not present

## 2022-05-05 DIAGNOSIS — D225 Melanocytic nevi of trunk: Secondary | ICD-10-CM | POA: Diagnosis not present

## 2022-05-05 DIAGNOSIS — Z09 Encounter for follow-up examination after completed treatment for conditions other than malignant neoplasm: Secondary | ICD-10-CM | POA: Diagnosis not present

## 2022-05-05 MED ORDER — "BD LUER-LOK SYRINGE 23G X 1-1/2"" 3 ML MISC"
0 refills | Status: DC
  Filled 2022-05-05: qty 6, 30d supply, fill #0

## 2022-05-06 ENCOUNTER — Other Ambulatory Visit (HOSPITAL_COMMUNITY): Payer: Self-pay

## 2022-05-10 DIAGNOSIS — Z7984 Long term (current) use of oral hypoglycemic drugs: Secondary | ICD-10-CM | POA: Diagnosis not present

## 2022-05-10 DIAGNOSIS — D122 Benign neoplasm of ascending colon: Secondary | ICD-10-CM | POA: Diagnosis not present

## 2022-05-10 DIAGNOSIS — D128 Benign neoplasm of rectum: Secondary | ICD-10-CM | POA: Diagnosis not present

## 2022-05-10 DIAGNOSIS — I10 Essential (primary) hypertension: Secondary | ICD-10-CM | POA: Diagnosis not present

## 2022-05-10 DIAGNOSIS — E785 Hyperlipidemia, unspecified: Secondary | ICD-10-CM | POA: Diagnosis not present

## 2022-05-10 DIAGNOSIS — Z1211 Encounter for screening for malignant neoplasm of colon: Secondary | ICD-10-CM | POA: Diagnosis not present

## 2022-05-10 DIAGNOSIS — K635 Polyp of colon: Secondary | ICD-10-CM | POA: Diagnosis not present

## 2022-05-10 DIAGNOSIS — D123 Benign neoplasm of transverse colon: Secondary | ICD-10-CM | POA: Diagnosis not present

## 2022-05-10 DIAGNOSIS — K649 Unspecified hemorrhoids: Secondary | ICD-10-CM | POA: Diagnosis not present

## 2022-05-10 DIAGNOSIS — D120 Benign neoplasm of cecum: Secondary | ICD-10-CM | POA: Diagnosis not present

## 2022-05-10 DIAGNOSIS — E119 Type 2 diabetes mellitus without complications: Secondary | ICD-10-CM | POA: Diagnosis not present

## 2022-05-10 DIAGNOSIS — K644 Residual hemorrhoidal skin tags: Secondary | ICD-10-CM | POA: Diagnosis not present

## 2022-05-11 ENCOUNTER — Ambulatory Visit: Payer: 59 | Admitting: Urology

## 2022-05-13 ENCOUNTER — Other Ambulatory Visit (HOSPITAL_COMMUNITY): Payer: Self-pay

## 2022-05-17 ENCOUNTER — Encounter: Payer: Self-pay | Admitting: Urology

## 2022-05-17 ENCOUNTER — Other Ambulatory Visit (HOSPITAL_COMMUNITY): Payer: Self-pay

## 2022-05-17 ENCOUNTER — Ambulatory Visit: Payer: 59 | Admitting: Urology

## 2022-05-17 VITALS — BP 126/79 | HR 80 | Ht 70.5 in | Wt 223.0 lb

## 2022-05-17 DIAGNOSIS — N529 Male erectile dysfunction, unspecified: Secondary | ICD-10-CM

## 2022-05-17 DIAGNOSIS — R350 Frequency of micturition: Secondary | ICD-10-CM | POA: Diagnosis not present

## 2022-05-17 DIAGNOSIS — N400 Enlarged prostate without lower urinary tract symptoms: Secondary | ICD-10-CM

## 2022-05-17 LAB — BLADDER SCAN AMB NON-IMAGING

## 2022-05-17 MED ORDER — SILDENAFIL CITRATE 100 MG PO TABS
100.0000 mg | ORAL_TABLET | Freq: Every day | ORAL | 11 refills | Status: AC | PRN
  Filled 2022-05-17: qty 30, 30d supply, fill #0

## 2022-05-17 NOTE — Patient Instructions (Signed)
Erectile Dysfunction ?Erectile dysfunction (ED) is the inability to get or keep an erection in order to have sexual intercourse. ED is considered a symptom of an underlying disorder and is not considered a disease. ED may include: ?Inability to get an erection. ?Lack of enough hardness of the erection to allow penetration. ?Loss of erection before sex is finished. ?What are the causes? ?This condition may be caused by: ?Physical causes, such as: ?Artery problems. This may include heart disease, high blood pressure, atherosclerosis, and diabetes. ?Hormonal problems, such as low testosterone. ?Obesity. ?Nerve problems. This may include back or pelvic injuries, multiple sclerosis, Parkinson's disease, spinal cord injury, and stroke. ?Certain medicines, such as: ?Pain relievers. ?Antidepressants. ?Blood pressure medicines and water pills (diuretics). ?Cancer medicines. ?Antihistamines. ?Muscle relaxants. ?Lifestyle factors, such as: ?Use of drugs such as marijuana, cocaine, or opioids. ?Excessive use of alcohol. ?Smoking. ?Lack of physical activity or exercise. ?Psychological causes, such as: ?Anxiety or stress. ?Sadness or depression. ?Exhaustion. ?Fear about sexual performance. ?Guilt. ?What are the signs or symptoms? ?Symptoms of this condition include: ?Inability to get an erection. ?Lack of enough hardness of the erection to allow penetration. ?Loss of the erection before sex is finished. ?Sometimes having normal erections, but with frequent unsatisfactory episodes. ?Low sexual satisfaction in either partner due to erection problems. ?A curved penis occurring with erection. The curve may cause pain, or the penis may be too curved to allow for intercourse. ?Never having nighttime or morning erections. ?How is this diagnosed? ?This condition is often diagnosed by: ?Performing a physical exam to find other diseases or specific problems with the penis. ?Asking you detailed questions about the problem. ?Doing tests,  such as: ?Blood tests to check for diabetes mellitus or high cholesterol, or to measure hormone levels. ?Other tests to check for underlying health conditions. ?An ultrasound exam to check for scarring. ?A test to check blood flow to the penis. ?Doing a sleep study at home to measure nighttime erections. ?How is this treated? ?This condition may be treated by: ?Medicines, such as: ?Medicine taken by mouth to help you achieve an erection (oral medicine). ?Hormone replacement therapy to replace low testosterone levels. ?Medicine that is injected into the penis. Your health care provider may instruct you how to give yourself these injections at home. ?Medicine that is delivered with a short applicator tube. The tube is inserted into the opening at the tip of the penis, which is the opening of the urethra. A tiny pellet of medicine is put in the urethra. The pellet dissolves and enhances erectile function. This is also called MUSE (medicated urethral system for erections) therapy. ?Vacuum pump. This is a pump with a ring on it. The pump and ring are placed on the penis and used to create pressure that helps the penis become erect. ?Penile implant surgery. In this procedure, you may receive: ?An inflatable implant. This consists of cylinders, a pump, and a reservoir. The cylinders can be inflated with a fluid that helps to create an erection, and they can be deflated after intercourse. ?A semi-rigid implant. This consists of two silicone rubber rods. The rods provide some rigidity. They are also flexible, so the penis can both curve downward in its normal position and become straight for sexual intercourse. ?Blood vessel surgery to improve blood flow to the penis. During this procedure, a blood vessel from a different part of the body is placed into the penis to allow blood to flow around (bypass) damaged or blocked blood vessels. ?Lifestyle changes,   such as exercising more, losing weight, and quitting smoking. ?Follow  these instructions at home: ?Medicines ? ?Take over-the-counter and prescription medicines only as told by your health care provider. Do not increase the dosage without first discussing it with your health care provider. ?If you are using self-injections, do injections as directed by your health care provider. Make sure you avoid any veins that are on the surface of the penis. After giving an injection, apply pressure to the injection site for 5 minutes. ?Talk to your health care provider about how to prevent headaches while taking ED medicines. These medicines may cause a sudden headache due to the increase in blood flow in your body. ?General instructions ?Exercise regularly, as directed by your health care provider. Work with your health care provider to lose weight, if needed. ?Do not use any products that contain nicotine or tobacco. These products include cigarettes, chewing tobacco, and vaping devices, such as e-cigarettes. If you need help quitting, ask your health care provider. ?Before using a vacuum pump, read the instructions that come with the pump and discuss any questions with your health care provider. ?Keep all follow-up visits. This is important. ?Contact a health care provider if: ?You feel nauseous. ?You are vomiting. ?You get sudden headaches while taking ED medicines. ?You have any concerns about your sexual health. ?Get help right away if: ?You are taking oral or injectable medicines and you have an erection that lasts longer than 4 hours. If your health care provider is unavailable, go to the nearest emergency room for evaluation. An erection that lasts much longer than 4 hours can result in permanent damage to your penis. ?You have severe pain in your groin or abdomen. ?You develop redness or severe swelling of your penis. ?You have redness spreading at your groin or lower abdomen. ?You are unable to urinate. ?You experience chest pain or a rapid heartbeat (palpitations) after taking oral  medicines. ?These symptoms may represent a serious problem that is an emergency. Do not wait to see if the symptoms will go away. Get medical help right away. Call your local emergency services (911 in the [place] Do not drive yourself to the hospital. ?Summary ?Erectile dysfunction (ED) is the inability to get or keep an erection during sexual intercourse. ?This condition is diagnosed based on a physical exam, your symptoms, and tests to determine the cause. Treatment varies depending on the cause and may include medicines, hormone therapy, surgery, or a vacuum pump. ?You may need follow-up visits to make sure that you are using your medicines or devices correctly. ?Get help right away if you are taking or injecting medicines and you have an erection that lasts longer than 4 hours. ?This information is not intended to replace advice given to you by your health care provider. Make sure you discuss any questions you have with your health care provider. ?Document Revised: 10/07/2020 Document Reviewed: 10/07/2020 ?Elsevier Patient Education ? 2023 [employer] ? ?

## 2022-05-17 NOTE — Progress Notes (Signed)
   05/17/2022 10:25 AM   Arlina Robes Bahar 05-15-1976 [id number]  Reason for visit: Follow up ED, urinary symptoms, testosterone  HPI: 46 year old male with ED since starting Cymbalta, some postvoid dribbling and weak stream, as well as testosterone therapy through PCP long-term.  There has also been some concern about a possible central nervous disorder, but I have been unable to find any neurology notes or a definitive diagnosis.  When he was off the Cymbalta he had improvement in his erections, but mood declined significantly.  At our last visit we opted to trial Flomax 0.4 mg daily for his urinary symptoms of weak stream and postvoid dribbling, and change his Cialis to sildenafil 100 mg as needed.  He does feel like the Flomax has improved the urinary symptoms, and he really denies any significant urinary complaints today aside from some occasional frequency.  The sildenafil helped with the firmness of his erections, but he is still having problems with maintaining an erection after initiating sexual activity.  We discussed options including a penile ring in conjunction with the sildenafil.  I recommended discuss seeing alternatives to the metoprolol with his PCP, as this certainly can contribute to ED.  Finally, we briefly discussed other alternatives for ED including penile injections, but he is not interested in pursuing those at this time.  -Sildenafil refilled, 100 mg on demand, consider using in conjunction with a penile ring -Recommended discussing alternative to metoprolol with PCP -Continue Flomax -Okay to set up with [facility] if he desires to consider intracavernosal injections in the future -RTC 1 year symptom check   Billey Co, MD  [facility] [address], [place], [place] [address] [phone]

## 2022-05-23 ENCOUNTER — Other Ambulatory Visit (HOSPITAL_COMMUNITY): Payer: Self-pay

## 2022-06-11 ENCOUNTER — Other Ambulatory Visit (HOSPITAL_COMMUNITY): Payer: Self-pay

## 2022-06-13 ENCOUNTER — Other Ambulatory Visit (HOSPITAL_COMMUNITY): Payer: Self-pay

## 2022-06-13 MED ORDER — TESTOSTERONE CYPIONATE 200 MG/ML IM SOLN
200.0000 mg | INTRAMUSCULAR | 1 refills | Status: DC
  Filled 2022-06-13: qty 6, 84d supply, fill #0
  Filled 2022-09-04 – 2022-09-26 (×5): qty 6, 84d supply, fill #1

## 2022-06-14 ENCOUNTER — Other Ambulatory Visit (HOSPITAL_COMMUNITY): Payer: Self-pay

## 2022-06-24 ENCOUNTER — Other Ambulatory Visit (HOSPITAL_COMMUNITY): Payer: Self-pay

## 2022-07-02 ENCOUNTER — Other Ambulatory Visit (HOSPITAL_COMMUNITY): Payer: Self-pay

## 2022-07-09 ENCOUNTER — Other Ambulatory Visit (HOSPITAL_COMMUNITY): Payer: Self-pay

## 2022-07-11 ENCOUNTER — Other Ambulatory Visit (HOSPITAL_COMMUNITY): Payer: Self-pay

## 2022-07-11 MED ORDER — GLIPIZIDE ER 10 MG PO TB24
20.0000 mg | ORAL_TABLET | Freq: Every day | ORAL | 1 refills | Status: DC
  Filled 2022-07-11: qty 180, 90d supply, fill #0
  Filled 2022-10-05: qty 180, 90d supply, fill #1

## 2022-07-13 ENCOUNTER — Other Ambulatory Visit (HOSPITAL_COMMUNITY): Payer: Self-pay

## 2022-07-14 ENCOUNTER — Other Ambulatory Visit: Payer: Self-pay

## 2022-07-14 ENCOUNTER — Other Ambulatory Visit (HOSPITAL_COMMUNITY): Payer: Self-pay

## 2022-07-14 MED ORDER — LOSARTAN POTASSIUM 100 MG PO TABS
100.0000 mg | ORAL_TABLET | Freq: Every day | ORAL | 1 refills | Status: DC
  Filled 2022-07-14 (×2): qty 90, 90d supply, fill #0
  Filled 2022-10-05: qty 90, 90d supply, fill #1

## 2022-07-21 ENCOUNTER — Other Ambulatory Visit (HOSPITAL_COMMUNITY): Payer: Self-pay

## 2022-07-22 ENCOUNTER — Other Ambulatory Visit: Payer: Self-pay

## 2022-07-22 ENCOUNTER — Other Ambulatory Visit (HOSPITAL_COMMUNITY): Payer: Self-pay

## 2022-07-22 MED ORDER — "BD DISP NEEDLES 18G X 1-1/2"" MISC"
1 refills | Status: DC
  Filled 2022-07-27: qty 6, 84d supply, fill #0

## 2022-07-22 MED ORDER — FENOFIBRATE 160 MG PO TABS
160.0000 mg | ORAL_TABLET | Freq: Every day | ORAL | 1 refills | Status: DC
  Filled 2022-07-22: qty 90, 90d supply, fill #0
  Filled 2022-10-20: qty 90, 90d supply, fill #1

## 2022-07-23 ENCOUNTER — Other Ambulatory Visit (HOSPITAL_COMMUNITY): Payer: Self-pay

## 2022-07-24 ENCOUNTER — Other Ambulatory Visit (HOSPITAL_COMMUNITY): Payer: Self-pay

## 2022-07-26 ENCOUNTER — Other Ambulatory Visit (HOSPITAL_COMMUNITY): Payer: Self-pay

## 2022-07-26 ENCOUNTER — Other Ambulatory Visit: Payer: Self-pay

## 2022-07-27 ENCOUNTER — Other Ambulatory Visit (HOSPITAL_COMMUNITY): Payer: Self-pay

## 2022-07-27 ENCOUNTER — Other Ambulatory Visit: Payer: Self-pay

## 2022-07-28 ENCOUNTER — Other Ambulatory Visit (HOSPITAL_COMMUNITY): Payer: Self-pay

## 2022-07-28 ENCOUNTER — Other Ambulatory Visit: Payer: Self-pay

## 2022-08-03 ENCOUNTER — Other Ambulatory Visit (HOSPITAL_COMMUNITY): Payer: Self-pay

## 2022-08-15 ENCOUNTER — Other Ambulatory Visit (HOSPITAL_COMMUNITY): Payer: Self-pay

## 2022-08-15 ENCOUNTER — Other Ambulatory Visit: Payer: Self-pay

## 2022-08-16 ENCOUNTER — Other Ambulatory Visit (HOSPITAL_COMMUNITY): Payer: Self-pay

## 2022-08-16 MED ORDER — ESOMEPRAZOLE MAGNESIUM 40 MG PO CPDR
40.0000 mg | DELAYED_RELEASE_CAPSULE | Freq: Two times a day (BID) | ORAL | 1 refills | Status: DC
  Filled 2022-08-16: qty 180, 90d supply, fill #0
  Filled 2022-11-08: qty 180, 90d supply, fill #1

## 2022-08-19 ENCOUNTER — Other Ambulatory Visit: Payer: Self-pay

## 2022-08-25 ENCOUNTER — Other Ambulatory Visit (HOSPITAL_COMMUNITY): Payer: Self-pay

## 2022-08-31 ENCOUNTER — Other Ambulatory Visit: Payer: Self-pay

## 2022-09-04 ENCOUNTER — Other Ambulatory Visit: Payer: Self-pay

## 2022-09-06 ENCOUNTER — Other Ambulatory Visit: Payer: Self-pay

## 2022-09-12 ENCOUNTER — Other Ambulatory Visit: Payer: Self-pay

## 2022-09-12 ENCOUNTER — Other Ambulatory Visit (HOSPITAL_COMMUNITY): Payer: Self-pay

## 2022-09-16 ENCOUNTER — Other Ambulatory Visit: Payer: Self-pay

## 2022-09-16 ENCOUNTER — Other Ambulatory Visit (HOSPITAL_COMMUNITY): Payer: Self-pay

## 2022-09-16 DIAGNOSIS — E349 Endocrine disorder, unspecified: Secondary | ICD-10-CM | POA: Diagnosis not present

## 2022-09-16 DIAGNOSIS — E1165 Type 2 diabetes mellitus with hyperglycemia: Secondary | ICD-10-CM | POA: Diagnosis not present

## 2022-09-16 DIAGNOSIS — E7849 Other hyperlipidemia: Secondary | ICD-10-CM | POA: Diagnosis not present

## 2022-09-16 DIAGNOSIS — Z125 Encounter for screening for malignant neoplasm of prostate: Secondary | ICD-10-CM | POA: Diagnosis not present

## 2022-09-22 ENCOUNTER — Other Ambulatory Visit: Payer: Self-pay

## 2022-09-22 ENCOUNTER — Other Ambulatory Visit (HOSPITAL_COMMUNITY): Payer: Self-pay

## 2022-09-23 ENCOUNTER — Other Ambulatory Visit: Payer: Self-pay

## 2022-09-23 ENCOUNTER — Other Ambulatory Visit (HOSPITAL_COMMUNITY): Payer: Self-pay

## 2022-09-23 DIAGNOSIS — E1165 Type 2 diabetes mellitus with hyperglycemia: Secondary | ICD-10-CM | POA: Diagnosis not present

## 2022-09-23 DIAGNOSIS — D1802 Hemangioma of intracranial structures: Secondary | ICD-10-CM | POA: Diagnosis not present

## 2022-09-23 DIAGNOSIS — G8929 Other chronic pain: Secondary | ICD-10-CM | POA: Diagnosis not present

## 2022-09-23 DIAGNOSIS — E349 Endocrine disorder, unspecified: Secondary | ICD-10-CM | POA: Diagnosis not present

## 2022-09-23 DIAGNOSIS — G4733 Obstructive sleep apnea (adult) (pediatric): Secondary | ICD-10-CM | POA: Diagnosis not present

## 2022-09-23 DIAGNOSIS — R1011 Right upper quadrant pain: Secondary | ICD-10-CM | POA: Diagnosis not present

## 2022-09-23 DIAGNOSIS — K219 Gastro-esophageal reflux disease without esophagitis: Secondary | ICD-10-CM | POA: Diagnosis not present

## 2022-09-23 DIAGNOSIS — E7849 Other hyperlipidemia: Secondary | ICD-10-CM | POA: Diagnosis not present

## 2022-09-23 DIAGNOSIS — I10 Essential (primary) hypertension: Secondary | ICD-10-CM | POA: Diagnosis not present

## 2022-09-23 DIAGNOSIS — Z0000 Encounter for general adult medical examination without abnormal findings: Secondary | ICD-10-CM | POA: Diagnosis not present

## 2022-09-23 MED ORDER — RYBELSUS 3 MG PO TABS
3.0000 mg | ORAL_TABLET | Freq: Every day | ORAL | 1 refills | Status: DC
  Filled 2022-09-23: qty 30, 30d supply, fill #0

## 2022-09-23 MED ORDER — TESTOSTERONE CYPIONATE 200 MG/ML IM SOLN
200.0000 mg | INTRAMUSCULAR | 1 refills | Status: DC
  Filled 2022-12-11: qty 6, 84d supply, fill #0
  Filled 2023-03-03: qty 6, 84d supply, fill #1

## 2022-09-23 MED ORDER — DULOXETINE HCL 30 MG PO CPEP
ORAL_CAPSULE | ORAL | 3 refills | Status: DC
  Filled 2022-09-23 – 2022-09-26 (×2): qty 37, 30d supply, fill #0
  Filled 2022-10-31: qty 30, 30d supply, fill #1
  Filled 2022-12-02: qty 30, 30d supply, fill #2
  Filled 2022-12-23 – 2022-12-26 (×2): qty 30, 30d supply, fill #3
  Filled 2023-01-08: qty 30, 30d supply, fill #4

## 2022-09-24 ENCOUNTER — Other Ambulatory Visit (HOSPITAL_BASED_OUTPATIENT_CLINIC_OR_DEPARTMENT_OTHER): Payer: Self-pay

## 2022-09-26 ENCOUNTER — Other Ambulatory Visit (HOSPITAL_COMMUNITY): Payer: Self-pay

## 2022-09-26 ENCOUNTER — Other Ambulatory Visit: Payer: Self-pay

## 2022-10-05 ENCOUNTER — Other Ambulatory Visit (HOSPITAL_COMMUNITY): Payer: Self-pay

## 2022-10-11 ENCOUNTER — Other Ambulatory Visit (HOSPITAL_COMMUNITY): Payer: Self-pay

## 2022-10-20 ENCOUNTER — Other Ambulatory Visit (HOSPITAL_COMMUNITY): Payer: Self-pay

## 2022-10-21 ENCOUNTER — Other Ambulatory Visit: Payer: Self-pay

## 2022-10-25 ENCOUNTER — Other Ambulatory Visit (HOSPITAL_COMMUNITY): Payer: Self-pay

## 2022-10-25 MED ORDER — CLONAZEPAM 0.5 MG PO TABS
0.5000 mg | ORAL_TABLET | Freq: Two times a day (BID) | ORAL | 1 refills | Status: DC | PRN
  Filled 2022-10-25: qty 180, 90d supply, fill #0
  Filled 2023-01-17 – 2023-01-24 (×2): qty 180, 90d supply, fill #1

## 2022-10-31 ENCOUNTER — Other Ambulatory Visit: Payer: Self-pay

## 2022-10-31 ENCOUNTER — Other Ambulatory Visit (HOSPITAL_COMMUNITY): Payer: Self-pay

## 2022-10-31 MED ORDER — RYBELSUS 7 MG PO TABS
7.0000 mg | ORAL_TABLET | Freq: Every day | ORAL | 11 refills | Status: DC
  Filled 2022-10-31: qty 30, 30d supply, fill #0
  Filled 2022-11-21 – 2022-11-25 (×3): qty 30, 30d supply, fill #1
  Filled 2022-12-23 – 2022-12-26 (×2): qty 30, 30d supply, fill #2
  Filled 2023-01-17 – 2023-01-20 (×2): qty 30, 30d supply, fill #3
  Filled 2023-02-16: qty 30, 30d supply, fill #4

## 2022-10-31 MED ORDER — METFORMIN HCL 500 MG PO TABS
500.0000 mg | ORAL_TABLET | Freq: Two times a day (BID) | ORAL | 3 refills | Status: DC
  Filled 2022-10-31: qty 180, 90d supply, fill #0
  Filled 2023-02-03: qty 180, 90d supply, fill #1
  Filled 2023-04-29: qty 180, 90d supply, fill #2
  Filled 2023-07-28: qty 180, 90d supply, fill #3

## 2022-11-08 ENCOUNTER — Other Ambulatory Visit (HOSPITAL_COMMUNITY): Payer: Self-pay

## 2022-11-11 ENCOUNTER — Other Ambulatory Visit (HOSPITAL_COMMUNITY): Payer: Self-pay

## 2022-11-12 ENCOUNTER — Other Ambulatory Visit (HOSPITAL_COMMUNITY): Payer: Self-pay

## 2022-11-12 MED ORDER — "BD LUER-LOK SYRINGE 23G X 1-1/2"" 3 ML MISC"
0 refills | Status: DC
  Filled 2022-11-12 (×2): qty 6, 6d supply, fill #0

## 2022-11-12 MED ORDER — "BD LUER-LOK SYRINGE 23G X 1"" 3 ML MISC"
1 refills | Status: AC
  Filled 2022-11-12 (×2): qty 6, 6d supply, fill #0
  Filled 2022-11-15 – 2022-11-17 (×2): qty 6, 30d supply, fill #0
  Filled 2023-02-05: qty 6, 84d supply, fill #0
  Filled 2023-04-29 – 2023-05-16 (×2): qty 6, 84d supply, fill #1

## 2022-11-14 ENCOUNTER — Other Ambulatory Visit (HOSPITAL_COMMUNITY): Payer: Self-pay

## 2022-11-15 ENCOUNTER — Other Ambulatory Visit (HOSPITAL_COMMUNITY): Payer: Self-pay

## 2022-11-15 ENCOUNTER — Other Ambulatory Visit: Payer: Self-pay

## 2022-11-16 ENCOUNTER — Other Ambulatory Visit: Payer: Self-pay

## 2022-11-17 ENCOUNTER — Other Ambulatory Visit (HOSPITAL_COMMUNITY): Payer: Self-pay

## 2022-11-17 ENCOUNTER — Other Ambulatory Visit: Payer: Self-pay

## 2022-11-18 ENCOUNTER — Other Ambulatory Visit (HOSPITAL_COMMUNITY): Payer: Self-pay

## 2022-11-18 MED ORDER — "BD DISP NEEDLES 18G X 1-1/2"" MISC"
1 refills | Status: DC
  Filled 2022-11-18: qty 6, 84d supply, fill #0
  Filled 2023-02-05: qty 6, 84d supply, fill #1

## 2022-11-19 ENCOUNTER — Other Ambulatory Visit (HOSPITAL_COMMUNITY): Payer: Self-pay

## 2022-11-22 ENCOUNTER — Other Ambulatory Visit: Payer: Self-pay

## 2022-11-23 ENCOUNTER — Other Ambulatory Visit: Payer: Self-pay

## 2022-11-24 ENCOUNTER — Other Ambulatory Visit (HOSPITAL_COMMUNITY): Payer: Self-pay

## 2022-11-25 ENCOUNTER — Other Ambulatory Visit: Payer: Self-pay

## 2022-11-25 ENCOUNTER — Other Ambulatory Visit (HOSPITAL_COMMUNITY): Payer: Self-pay

## 2022-11-25 ENCOUNTER — Encounter (HOSPITAL_COMMUNITY): Payer: Self-pay

## 2022-11-25 MED ORDER — SODIUM FLUORIDE 1.1 % DT GEL
Freq: Three times a day (TID) | DENTAL | 5 refills | Status: DC
  Filled 2022-11-25 (×2): qty 56, 30d supply, fill #0

## 2022-11-28 ENCOUNTER — Other Ambulatory Visit (HOSPITAL_COMMUNITY): Payer: Self-pay

## 2022-12-02 ENCOUNTER — Other Ambulatory Visit (HOSPITAL_COMMUNITY): Payer: Self-pay

## 2022-12-12 ENCOUNTER — Other Ambulatory Visit (HOSPITAL_COMMUNITY): Payer: Self-pay

## 2022-12-12 ENCOUNTER — Other Ambulatory Visit: Payer: Self-pay

## 2022-12-17 ENCOUNTER — Other Ambulatory Visit (HOSPITAL_COMMUNITY): Payer: Self-pay

## 2022-12-23 ENCOUNTER — Other Ambulatory Visit (HOSPITAL_COMMUNITY): Payer: Self-pay

## 2022-12-23 ENCOUNTER — Other Ambulatory Visit: Payer: Self-pay

## 2022-12-30 ENCOUNTER — Other Ambulatory Visit (HOSPITAL_COMMUNITY): Payer: Self-pay

## 2022-12-31 ENCOUNTER — Other Ambulatory Visit (HOSPITAL_COMMUNITY): Payer: Self-pay

## 2023-01-02 ENCOUNTER — Other Ambulatory Visit: Payer: Self-pay

## 2023-01-02 ENCOUNTER — Other Ambulatory Visit (HOSPITAL_COMMUNITY): Payer: Self-pay

## 2023-01-02 MED ORDER — GLIPIZIDE ER 10 MG PO TB24
20.0000 mg | ORAL_TABLET | Freq: Every day | ORAL | 1 refills | Status: DC
  Filled 2023-01-02: qty 180, 90d supply, fill #0
  Filled 2023-04-02: qty 180, 90d supply, fill #1

## 2023-01-03 ENCOUNTER — Other Ambulatory Visit (HOSPITAL_COMMUNITY): Payer: Self-pay

## 2023-01-04 ENCOUNTER — Other Ambulatory Visit (HOSPITAL_COMMUNITY): Payer: Self-pay

## 2023-01-04 ENCOUNTER — Other Ambulatory Visit: Payer: Self-pay

## 2023-01-04 MED ORDER — LOSARTAN POTASSIUM 100 MG PO TABS
100.0000 mg | ORAL_TABLET | Freq: Every day | ORAL | 1 refills | Status: DC
  Filled 2023-01-04: qty 90, 90d supply, fill #0
  Filled 2023-04-05: qty 90, 90d supply, fill #1

## 2023-01-09 ENCOUNTER — Other Ambulatory Visit (HOSPITAL_COMMUNITY): Payer: Self-pay

## 2023-01-13 ENCOUNTER — Other Ambulatory Visit (HOSPITAL_COMMUNITY): Payer: Self-pay

## 2023-01-13 ENCOUNTER — Other Ambulatory Visit: Payer: Self-pay

## 2023-01-13 MED ORDER — DULOXETINE HCL 30 MG PO CPEP
30.0000 mg | ORAL_CAPSULE | Freq: Every day | ORAL | 1 refills | Status: DC
  Filled 2023-01-13: qty 30, 30d supply, fill #0
  Filled 2023-01-13: qty 90, 90d supply, fill #0

## 2023-01-13 MED ORDER — DULOXETINE HCL 30 MG PO CPEP
60.0000 mg | ORAL_CAPSULE | Freq: Every day | ORAL | 1 refills | Status: DC
  Filled 2023-01-13: qty 90, 45d supply, fill #0

## 2023-01-13 MED ORDER — DULOXETINE HCL 60 MG PO CPEP
60.0000 mg | ORAL_CAPSULE | Freq: Every day | ORAL | 5 refills | Status: DC
  Filled 2023-01-13 – 2023-04-15 (×2): qty 30, 30d supply, fill #0

## 2023-01-15 ENCOUNTER — Other Ambulatory Visit (HOSPITAL_COMMUNITY): Payer: Self-pay

## 2023-01-16 ENCOUNTER — Other Ambulatory Visit: Payer: Self-pay

## 2023-01-16 ENCOUNTER — Other Ambulatory Visit (HOSPITAL_COMMUNITY): Payer: Self-pay

## 2023-01-16 MED ORDER — FENOFIBRATE 160 MG PO TABS
160.0000 mg | ORAL_TABLET | Freq: Every day | ORAL | 1 refills | Status: DC
  Filled 2023-01-16: qty 90, 90d supply, fill #0
  Filled 2023-04-17: qty 90, 90d supply, fill #1

## 2023-01-16 MED ORDER — METOPROLOL TARTRATE 25 MG PO TABS
25.0000 mg | ORAL_TABLET | Freq: Two times a day (BID) | ORAL | 3 refills | Status: DC
  Filled 2023-01-16: qty 180, 90d supply, fill #0
  Filled 2023-04-17: qty 180, 90d supply, fill #1
  Filled 2023-07-19: qty 180, 90d supply, fill #2
  Filled 2023-10-13: qty 180, 90d supply, fill #3
  Filled 2024-01-04: qty 180, 90d supply, fill #4

## 2023-01-17 ENCOUNTER — Other Ambulatory Visit: Payer: Self-pay

## 2023-01-17 DIAGNOSIS — H02831 Dermatochalasis of right upper eyelid: Secondary | ICD-10-CM | POA: Diagnosis not present

## 2023-01-17 DIAGNOSIS — E119 Type 2 diabetes mellitus without complications: Secondary | ICD-10-CM | POA: Diagnosis not present

## 2023-01-19 ENCOUNTER — Other Ambulatory Visit (HOSPITAL_COMMUNITY): Payer: Self-pay

## 2023-01-19 MED ORDER — DULOXETINE HCL 60 MG PO CPEP
60.0000 mg | ORAL_CAPSULE | Freq: Every day | ORAL | 3 refills | Status: DC
  Filled 2023-01-19 – 2023-02-05 (×2): qty 90, 90d supply, fill #0
  Filled 2023-04-20: qty 90, 90d supply, fill #1
  Filled 2023-07-10 – 2023-07-19 (×2): qty 90, 90d supply, fill #2
  Filled 2023-10-08: qty 90, 90d supply, fill #3

## 2023-01-20 ENCOUNTER — Other Ambulatory Visit (HOSPITAL_COMMUNITY): Payer: Self-pay

## 2023-01-20 ENCOUNTER — Other Ambulatory Visit (HOSPITAL_BASED_OUTPATIENT_CLINIC_OR_DEPARTMENT_OTHER): Payer: Self-pay

## 2023-01-24 ENCOUNTER — Other Ambulatory Visit (HOSPITAL_COMMUNITY): Payer: Self-pay

## 2023-01-24 ENCOUNTER — Other Ambulatory Visit: Payer: Self-pay

## 2023-01-31 ENCOUNTER — Other Ambulatory Visit: Payer: Self-pay

## 2023-02-03 ENCOUNTER — Other Ambulatory Visit (HOSPITAL_COMMUNITY): Payer: Self-pay

## 2023-02-05 ENCOUNTER — Other Ambulatory Visit (HOSPITAL_COMMUNITY): Payer: Self-pay

## 2023-02-06 ENCOUNTER — Other Ambulatory Visit (HOSPITAL_COMMUNITY): Payer: Self-pay

## 2023-02-06 ENCOUNTER — Other Ambulatory Visit: Payer: Self-pay

## 2023-02-06 MED ORDER — ESOMEPRAZOLE MAGNESIUM 40 MG PO CPDR
40.0000 mg | DELAYED_RELEASE_CAPSULE | Freq: Two times a day (BID) | ORAL | 1 refills | Status: DC
  Filled 2023-02-06: qty 180, 90d supply, fill #0
  Filled 2023-05-08: qty 180, 90d supply, fill #1

## 2023-02-15 ENCOUNTER — Other Ambulatory Visit (HOSPITAL_COMMUNITY): Payer: Self-pay

## 2023-02-17 ENCOUNTER — Other Ambulatory Visit: Payer: Self-pay

## 2023-03-04 ENCOUNTER — Other Ambulatory Visit (HOSPITAL_COMMUNITY): Payer: Self-pay

## 2023-03-06 ENCOUNTER — Other Ambulatory Visit: Payer: Self-pay

## 2023-03-13 DIAGNOSIS — E1165 Type 2 diabetes mellitus with hyperglycemia: Secondary | ICD-10-CM | POA: Diagnosis not present

## 2023-03-13 DIAGNOSIS — E349 Endocrine disorder, unspecified: Secondary | ICD-10-CM | POA: Diagnosis not present

## 2023-03-13 DIAGNOSIS — I10 Essential (primary) hypertension: Secondary | ICD-10-CM | POA: Diagnosis not present

## 2023-03-13 DIAGNOSIS — E7849 Other hyperlipidemia: Secondary | ICD-10-CM | POA: Diagnosis not present

## 2023-03-20 ENCOUNTER — Other Ambulatory Visit (HOSPITAL_COMMUNITY): Payer: Self-pay

## 2023-03-20 ENCOUNTER — Other Ambulatory Visit: Payer: Self-pay

## 2023-03-20 DIAGNOSIS — D1802 Hemangioma of intracranial structures: Secondary | ICD-10-CM | POA: Diagnosis not present

## 2023-03-20 DIAGNOSIS — K219 Gastro-esophageal reflux disease without esophagitis: Secondary | ICD-10-CM | POA: Diagnosis not present

## 2023-03-20 DIAGNOSIS — E1165 Type 2 diabetes mellitus with hyperglycemia: Secondary | ICD-10-CM | POA: Diagnosis not present

## 2023-03-20 DIAGNOSIS — Z125 Encounter for screening for malignant neoplasm of prostate: Secondary | ICD-10-CM | POA: Diagnosis not present

## 2023-03-20 DIAGNOSIS — E349 Endocrine disorder, unspecified: Secondary | ICD-10-CM | POA: Diagnosis not present

## 2023-03-20 DIAGNOSIS — I10 Essential (primary) hypertension: Secondary | ICD-10-CM | POA: Diagnosis not present

## 2023-03-20 DIAGNOSIS — E785 Hyperlipidemia, unspecified: Secondary | ICD-10-CM | POA: Diagnosis not present

## 2023-03-20 DIAGNOSIS — G4733 Obstructive sleep apnea (adult) (pediatric): Secondary | ICD-10-CM | POA: Diagnosis not present

## 2023-03-20 DIAGNOSIS — E7849 Other hyperlipidemia: Secondary | ICD-10-CM | POA: Diagnosis not present

## 2023-03-20 DIAGNOSIS — R1011 Right upper quadrant pain: Secondary | ICD-10-CM | POA: Diagnosis not present

## 2023-03-20 MED ORDER — RYBELSUS 14 MG PO TABS
14.0000 mg | ORAL_TABLET | Freq: Every day | ORAL | 3 refills | Status: DC
Start: 2023-03-20 — End: 2024-02-28
  Filled 2023-03-20: qty 30, 30d supply, fill #0
  Filled 2023-04-15: qty 30, 30d supply, fill #1
  Filled 2023-05-15: qty 30, 30d supply, fill #2
  Filled 2023-06-14: qty 30, 30d supply, fill #3
  Filled 2023-07-12: qty 30, 30d supply, fill #4
  Filled 2023-08-10: qty 30, 30d supply, fill #5
  Filled 2023-09-08: qty 90, 90d supply, fill #6
  Filled 2023-11-30: qty 90, 90d supply, fill #7

## 2023-03-23 ENCOUNTER — Other Ambulatory Visit: Payer: Self-pay

## 2023-03-23 ENCOUNTER — Other Ambulatory Visit: Payer: Self-pay | Admitting: Urology

## 2023-03-23 MED ORDER — TAMSULOSIN HCL 0.4 MG PO CAPS
0.4000 mg | ORAL_CAPSULE | Freq: Every day | ORAL | 1 refills | Status: DC
  Filled 2023-03-23: qty 30, 30d supply, fill #0
  Filled 2023-04-15 – 2023-04-17 (×2): qty 30, 30d supply, fill #1

## 2023-03-31 DIAGNOSIS — Z23 Encounter for immunization: Secondary | ICD-10-CM | POA: Diagnosis not present

## 2023-04-02 ENCOUNTER — Other Ambulatory Visit (HOSPITAL_COMMUNITY): Payer: Self-pay

## 2023-04-03 ENCOUNTER — Other Ambulatory Visit (HOSPITAL_COMMUNITY): Payer: Self-pay

## 2023-04-04 ENCOUNTER — Other Ambulatory Visit: Payer: Self-pay

## 2023-04-04 ENCOUNTER — Other Ambulatory Visit (HOSPITAL_COMMUNITY): Payer: Self-pay

## 2023-04-04 MED ORDER — ROSUVASTATIN CALCIUM 40 MG PO TABS
40.0000 mg | ORAL_TABLET | Freq: Every day | ORAL | 3 refills | Status: DC
  Filled 2023-04-04: qty 90, 90d supply, fill #0
  Filled 2023-06-29: qty 90, 90d supply, fill #1
  Filled 2023-09-27: qty 90, 90d supply, fill #2
  Filled 2024-01-04: qty 90, 90d supply, fill #3

## 2023-04-05 ENCOUNTER — Other Ambulatory Visit (HOSPITAL_COMMUNITY): Payer: Self-pay

## 2023-04-15 ENCOUNTER — Other Ambulatory Visit (HOSPITAL_COMMUNITY): Payer: Self-pay

## 2023-04-17 ENCOUNTER — Other Ambulatory Visit: Payer: Self-pay

## 2023-04-17 ENCOUNTER — Other Ambulatory Visit (HOSPITAL_COMMUNITY): Payer: Self-pay

## 2023-04-17 MED ORDER — CLONAZEPAM 0.5 MG PO TABS
0.5000 mg | ORAL_TABLET | Freq: Two times a day (BID) | ORAL | 1 refills | Status: DC | PRN
  Filled 2023-04-17: qty 180, 90d supply, fill #0
  Filled 2023-07-19: qty 180, 90d supply, fill #1

## 2023-04-21 ENCOUNTER — Other Ambulatory Visit (HOSPITAL_COMMUNITY): Payer: Self-pay

## 2023-04-29 ENCOUNTER — Other Ambulatory Visit (HOSPITAL_COMMUNITY): Payer: Self-pay

## 2023-05-05 ENCOUNTER — Other Ambulatory Visit: Payer: Self-pay

## 2023-05-05 ENCOUNTER — Other Ambulatory Visit (HOSPITAL_COMMUNITY): Payer: Self-pay

## 2023-05-05 MED ORDER — "BD DISP NEEDLES 18G X 1-1/2"" MISC"
1 refills | Status: DC
  Filled 2023-05-05: qty 6, 84d supply, fill #0
  Filled 2023-07-23: qty 6, 84d supply, fill #1

## 2023-05-08 ENCOUNTER — Other Ambulatory Visit (HOSPITAL_COMMUNITY): Payer: Self-pay

## 2023-05-12 ENCOUNTER — Other Ambulatory Visit (HOSPITAL_COMMUNITY): Payer: Self-pay

## 2023-05-15 ENCOUNTER — Other Ambulatory Visit (HOSPITAL_COMMUNITY): Payer: Self-pay

## 2023-05-15 ENCOUNTER — Other Ambulatory Visit: Payer: Self-pay

## 2023-05-16 ENCOUNTER — Ambulatory Visit: Payer: Commercial Managed Care - PPO | Admitting: Urology

## 2023-05-16 ENCOUNTER — Encounter: Payer: Self-pay | Admitting: Urology

## 2023-05-16 ENCOUNTER — Other Ambulatory Visit: Payer: Self-pay

## 2023-05-16 VITALS — BP 111/76 | HR 88 | Ht 70.5 in | Wt 220.0 lb

## 2023-05-16 DIAGNOSIS — N529 Male erectile dysfunction, unspecified: Secondary | ICD-10-CM

## 2023-05-16 DIAGNOSIS — R399 Unspecified symptoms and signs involving the genitourinary system: Secondary | ICD-10-CM

## 2023-05-16 DIAGNOSIS — E291 Testicular hypofunction: Secondary | ICD-10-CM | POA: Diagnosis not present

## 2023-05-16 DIAGNOSIS — Z125 Encounter for screening for malignant neoplasm of prostate: Secondary | ICD-10-CM

## 2023-05-16 MED ORDER — TAMSULOSIN HCL 0.4 MG PO CAPS
0.4000 mg | ORAL_CAPSULE | Freq: Every day | ORAL | 3 refills | Status: DC
  Filled 2023-05-16 (×2): qty 90, 90d supply, fill #0
  Filled 2023-08-16: qty 90, 90d supply, fill #1
  Filled 2023-11-04: qty 90, 90d supply, fill #2
  Filled 2024-02-08: qty 90, 90d supply, fill #3

## 2023-05-16 MED ORDER — TADALAFIL 20 MG PO TABS
20.0000 mg | ORAL_TABLET | Freq: Every day | ORAL | 6 refills | Status: DC | PRN
  Filled 2023-05-16 (×2): qty 6, 30d supply, fill #0

## 2023-05-16 NOTE — Progress Notes (Signed)
   05/16/2023 9:08 AM   Dustin Perkins [birth year] [id number]  Reason for visit: Follow up ED, urinary symptoms, hypogonadism, PSA screening  HPI: 47 year old male with ED since starting Cymbalta, some postvoid dribbling and weak stream, as well as testosterone therapy through PCP long-term.  There has also been some concern about a possible central nervous disorder, but I have been unable to find any neurology notes or a definitive diagnosis.  When he was off the Cymbalta he had improvement in his erections, but mood declined significantly.  In terms of his urinary symptoms, currently on Flomax 0.4 mg daily which he feels is helpful.  He still has some mild postvoid dribbling.  We discussed he could try doubling the dose if urinary symptoms worsen.  He feels like his urinary symptoms fluctuate by the season.  He also has diabetes on Jardiance, recent hemoglobin A1c 7, which likely contributes to his urinary symptoms and frequency.  Regarding ED, the sildenafil is only moderately effective.  The Cymbalta is the primary issue with his erections.  Is interested in trying Cialis again, risk and benefits were discussed.  We had discussed penile injections previously and he was not interested.  Hypogonadism continues to be managed by PCP on 200 mg testosterone injections every other week, levels have been around 500-800, hematocrit normal, PSA normal at 0.57.   -Continue Flomax, okay to take double dose -Trial of Cialis 20 mg as needed, can change back to sildenafil if not as effective -Continue testosterone through PCP. -RTC 1 year symptom check, PVR   Sondra Come, MD  [facility] [address], [address] [place], [place] [address] [phone]

## 2023-05-17 ENCOUNTER — Other Ambulatory Visit: Payer: Self-pay

## 2023-05-17 ENCOUNTER — Other Ambulatory Visit (HOSPITAL_COMMUNITY): Payer: Self-pay

## 2023-05-17 MED ORDER — BD LUER-LOK SYRINGE 23G X 1-1/2" 3 ML MISC
0 refills | Status: DC
  Filled 2023-05-17: qty 6, 84d supply, fill #0

## 2023-05-17 MED ORDER — JARDIANCE 25 MG PO TABS
25.0000 mg | ORAL_TABLET | Freq: Every day | ORAL | 3 refills | Status: DC
  Filled 2023-05-17: qty 90, 90d supply, fill #0
  Filled 2023-05-17 – 2023-08-10 (×2): qty 90, 90d supply, fill #1
  Filled 2023-11-04: qty 90, 90d supply, fill #2
  Filled 2024-01-04 – 2024-01-16 (×2): qty 90, 90d supply, fill #3

## 2023-05-28 ENCOUNTER — Other Ambulatory Visit (HOSPITAL_COMMUNITY): Payer: Self-pay

## 2023-05-31 ENCOUNTER — Other Ambulatory Visit (HOSPITAL_COMMUNITY): Payer: Self-pay

## 2023-06-02 ENCOUNTER — Other Ambulatory Visit (HOSPITAL_COMMUNITY): Payer: Self-pay

## 2023-06-02 MED ORDER — TESTOSTERONE CYPIONATE 200 MG/ML IM SOLN
200.0000 mg | INTRAMUSCULAR | 1 refills | Status: DC
  Filled 2023-06-02: qty 6, 84d supply, fill #0
  Filled 2023-08-20: qty 10, 140d supply, fill #1
  Filled 2023-08-23: qty 6, 84d supply, fill #1
  Filled 2023-11-12: qty 6, 84d supply, fill #2

## 2023-06-14 ENCOUNTER — Other Ambulatory Visit: Payer: Self-pay

## 2023-06-14 ENCOUNTER — Other Ambulatory Visit (HOSPITAL_COMMUNITY): Payer: Self-pay

## 2023-06-25 ENCOUNTER — Other Ambulatory Visit (HOSPITAL_COMMUNITY): Payer: Self-pay

## 2023-06-26 ENCOUNTER — Other Ambulatory Visit: Payer: Self-pay

## 2023-06-26 ENCOUNTER — Other Ambulatory Visit (HOSPITAL_COMMUNITY): Payer: Self-pay

## 2023-06-26 MED ORDER — GLIPIZIDE ER 10 MG PO TB24
20.0000 mg | ORAL_TABLET | Freq: Every day | ORAL | 1 refills | Status: DC
  Filled 2023-06-26: qty 180, 90d supply, fill #0
  Filled 2023-09-25: qty 180, 90d supply, fill #1

## 2023-06-27 ENCOUNTER — Other Ambulatory Visit (HOSPITAL_COMMUNITY): Payer: Self-pay

## 2023-06-27 ENCOUNTER — Other Ambulatory Visit: Payer: Self-pay

## 2023-06-27 MED ORDER — LOSARTAN POTASSIUM 100 MG PO TABS
100.0000 mg | ORAL_TABLET | Freq: Every day | ORAL | 1 refills | Status: DC
  Filled 2023-06-27: qty 90, 90d supply, fill #0
  Filled 2023-09-27: qty 90, 90d supply, fill #1

## 2023-06-29 ENCOUNTER — Other Ambulatory Visit: Payer: Self-pay

## 2023-07-10 ENCOUNTER — Other Ambulatory Visit (HOSPITAL_COMMUNITY): Payer: Self-pay

## 2023-07-11 ENCOUNTER — Other Ambulatory Visit: Payer: Self-pay

## 2023-07-11 ENCOUNTER — Other Ambulatory Visit (HOSPITAL_COMMUNITY): Payer: Self-pay

## 2023-07-11 MED ORDER — FENOFIBRATE 160 MG PO TABS
160.0000 mg | ORAL_TABLET | Freq: Every day | ORAL | 1 refills | Status: DC
  Filled 2023-07-11: qty 90, 90d supply, fill #0
  Filled 2023-10-08: qty 90, 90d supply, fill #1

## 2023-07-12 ENCOUNTER — Other Ambulatory Visit: Payer: Self-pay

## 2023-07-19 ENCOUNTER — Other Ambulatory Visit (HOSPITAL_COMMUNITY): Payer: Self-pay

## 2023-07-23 ENCOUNTER — Other Ambulatory Visit (HOSPITAL_COMMUNITY): Payer: Self-pay

## 2023-07-24 ENCOUNTER — Other Ambulatory Visit: Payer: Self-pay

## 2023-07-28 ENCOUNTER — Other Ambulatory Visit: Payer: Self-pay

## 2023-08-06 ENCOUNTER — Other Ambulatory Visit (HOSPITAL_COMMUNITY): Payer: Self-pay

## 2023-08-10 ENCOUNTER — Other Ambulatory Visit (HOSPITAL_COMMUNITY): Payer: Self-pay

## 2023-08-10 ENCOUNTER — Other Ambulatory Visit: Payer: Self-pay

## 2023-08-10 MED ORDER — ESOMEPRAZOLE MAGNESIUM 40 MG PO CPDR
40.0000 mg | DELAYED_RELEASE_CAPSULE | Freq: Two times a day (BID) | ORAL | 1 refills | Status: DC
  Filled 2023-08-10: qty 180, 90d supply, fill #0
  Filled 2024-02-08: qty 180, 90d supply, fill #1

## 2023-08-11 ENCOUNTER — Other Ambulatory Visit (HOSPITAL_COMMUNITY): Payer: Self-pay

## 2023-08-11 ENCOUNTER — Other Ambulatory Visit (HOSPITAL_BASED_OUTPATIENT_CLINIC_OR_DEPARTMENT_OTHER): Payer: Self-pay

## 2023-08-11 MED ORDER — ESOMEPRAZOLE MAGNESIUM 40 MG PO CPDR
40.0000 mg | DELAYED_RELEASE_CAPSULE | Freq: Two times a day (BID) | ORAL | 1 refills | Status: AC
  Filled 2023-08-11 – 2023-11-04 (×2): qty 180, 90d supply, fill #0
  Filled 2024-02-08 – 2024-05-04 (×2): qty 180, 90d supply, fill #1

## 2023-08-14 ENCOUNTER — Other Ambulatory Visit (HOSPITAL_COMMUNITY): Payer: Self-pay

## 2023-08-15 ENCOUNTER — Other Ambulatory Visit (HOSPITAL_COMMUNITY): Payer: Self-pay

## 2023-08-15 MED ORDER — EASY TOUCH HYPODERMIC NEEDLE 18G X 1-1/2" MISC
1 refills | Status: DC
  Filled 2023-08-15: qty 6, 84d supply, fill #0
  Filled 2024-01-08: qty 6, 84d supply, fill #1

## 2023-08-16 ENCOUNTER — Other Ambulatory Visit (HOSPITAL_COMMUNITY): Payer: Self-pay

## 2023-08-20 ENCOUNTER — Other Ambulatory Visit (HOSPITAL_COMMUNITY): Payer: Self-pay

## 2023-08-23 ENCOUNTER — Other Ambulatory Visit: Payer: Self-pay

## 2023-09-08 ENCOUNTER — Other Ambulatory Visit: Payer: Self-pay

## 2023-09-18 DIAGNOSIS — Z125 Encounter for screening for malignant neoplasm of prostate: Secondary | ICD-10-CM | POA: Diagnosis not present

## 2023-09-18 DIAGNOSIS — E785 Hyperlipidemia, unspecified: Secondary | ICD-10-CM | POA: Diagnosis not present

## 2023-09-18 DIAGNOSIS — E349 Endocrine disorder, unspecified: Secondary | ICD-10-CM | POA: Diagnosis not present

## 2023-09-18 DIAGNOSIS — E1165 Type 2 diabetes mellitus with hyperglycemia: Secondary | ICD-10-CM | POA: Diagnosis not present

## 2023-09-25 ENCOUNTER — Other Ambulatory Visit (HOSPITAL_COMMUNITY): Payer: Self-pay

## 2023-09-27 ENCOUNTER — Other Ambulatory Visit (HOSPITAL_COMMUNITY): Payer: Self-pay

## 2023-10-04 DIAGNOSIS — E7849 Other hyperlipidemia: Secondary | ICD-10-CM | POA: Diagnosis not present

## 2023-10-04 DIAGNOSIS — I10 Essential (primary) hypertension: Secondary | ICD-10-CM | POA: Diagnosis not present

## 2023-10-04 DIAGNOSIS — G4733 Obstructive sleep apnea (adult) (pediatric): Secondary | ICD-10-CM | POA: Diagnosis not present

## 2023-10-04 DIAGNOSIS — K219 Gastro-esophageal reflux disease without esophagitis: Secondary | ICD-10-CM | POA: Diagnosis not present

## 2023-10-04 DIAGNOSIS — E349 Endocrine disorder, unspecified: Secondary | ICD-10-CM | POA: Diagnosis not present

## 2023-10-04 DIAGNOSIS — D1802 Hemangioma of intracranial structures: Secondary | ICD-10-CM | POA: Diagnosis not present

## 2023-10-04 DIAGNOSIS — E1165 Type 2 diabetes mellitus with hyperglycemia: Secondary | ICD-10-CM | POA: Diagnosis not present

## 2023-10-04 DIAGNOSIS — Z0000 Encounter for general adult medical examination without abnormal findings: Secondary | ICD-10-CM | POA: Diagnosis not present

## 2023-10-08 ENCOUNTER — Other Ambulatory Visit (HOSPITAL_COMMUNITY): Payer: Self-pay

## 2023-10-09 ENCOUNTER — Other Ambulatory Visit (HOSPITAL_COMMUNITY): Payer: Self-pay

## 2023-10-09 MED ORDER — CLONAZEPAM 0.5 MG PO TABS
0.5000 mg | ORAL_TABLET | Freq: Two times a day (BID) | ORAL | 1 refills | Status: DC | PRN
  Filled 2023-10-15: qty 180, 90d supply, fill #0
  Filled 2024-01-04 – 2024-01-11 (×2): qty 180, 90d supply, fill #1

## 2023-10-13 ENCOUNTER — Other Ambulatory Visit: Payer: Self-pay

## 2023-10-16 ENCOUNTER — Other Ambulatory Visit: Payer: Self-pay

## 2023-11-04 ENCOUNTER — Other Ambulatory Visit (HOSPITAL_COMMUNITY): Payer: Self-pay

## 2023-11-04 ENCOUNTER — Other Ambulatory Visit (HOSPITAL_BASED_OUTPATIENT_CLINIC_OR_DEPARTMENT_OTHER): Payer: Self-pay

## 2023-11-12 ENCOUNTER — Other Ambulatory Visit (HOSPITAL_COMMUNITY): Payer: Self-pay

## 2023-11-13 ENCOUNTER — Other Ambulatory Visit (HOSPITAL_COMMUNITY): Payer: Self-pay

## 2023-11-13 ENCOUNTER — Other Ambulatory Visit: Payer: Self-pay

## 2023-11-13 MED ORDER — METFORMIN HCL 500 MG PO TABS
500.0000 mg | ORAL_TABLET | Freq: Two times a day (BID) | ORAL | 3 refills | Status: AC
  Filled 2023-11-13: qty 180, 90d supply, fill #0
  Filled 2024-02-08: qty 180, 90d supply, fill #1
  Filled 2024-08-25: qty 180, 90d supply, fill #2

## 2023-11-29 ENCOUNTER — Other Ambulatory Visit: Payer: Self-pay

## 2023-11-29 ENCOUNTER — Other Ambulatory Visit (HOSPITAL_COMMUNITY): Payer: Self-pay

## 2023-11-29 DIAGNOSIS — L578 Other skin changes due to chronic exposure to nonionizing radiation: Secondary | ICD-10-CM | POA: Diagnosis not present

## 2023-11-29 DIAGNOSIS — L814 Other melanin hyperpigmentation: Secondary | ICD-10-CM | POA: Diagnosis not present

## 2023-11-29 DIAGNOSIS — Z85828 Personal history of other malignant neoplasm of skin: Secondary | ICD-10-CM | POA: Diagnosis not present

## 2023-11-29 DIAGNOSIS — B359 Dermatophytosis, unspecified: Secondary | ICD-10-CM | POA: Diagnosis not present

## 2023-11-29 DIAGNOSIS — L570 Actinic keratosis: Secondary | ICD-10-CM | POA: Diagnosis not present

## 2023-11-29 DIAGNOSIS — D229 Melanocytic nevi, unspecified: Secondary | ICD-10-CM | POA: Diagnosis not present

## 2023-11-29 DIAGNOSIS — L821 Other seborrheic keratosis: Secondary | ICD-10-CM | POA: Diagnosis not present

## 2023-11-29 MED ORDER — KETOCONAZOLE 2 % EX CREA
TOPICAL_CREAM | CUTANEOUS | 2 refills | Status: AC
Start: 2023-11-29 — End: ?
  Filled 2023-11-29: qty 60, 30d supply, fill #0

## 2023-11-30 ENCOUNTER — Other Ambulatory Visit (HOSPITAL_COMMUNITY): Payer: Self-pay

## 2023-11-30 ENCOUNTER — Other Ambulatory Visit: Payer: Self-pay

## 2023-12-22 ENCOUNTER — Other Ambulatory Visit (HOSPITAL_COMMUNITY): Payer: Self-pay

## 2023-12-25 ENCOUNTER — Other Ambulatory Visit (HOSPITAL_COMMUNITY): Payer: Self-pay

## 2023-12-25 MED ORDER — GLIPIZIDE ER 10 MG PO TB24
20.0000 mg | ORAL_TABLET | Freq: Every day | ORAL | 1 refills | Status: DC
  Filled 2023-12-25: qty 180, 90d supply, fill #0
  Filled 2024-03-28: qty 180, 90d supply, fill #1

## 2023-12-26 ENCOUNTER — Other Ambulatory Visit (HOSPITAL_COMMUNITY): Payer: Self-pay

## 2023-12-26 MED ORDER — DULOXETINE HCL 60 MG PO CPEP
60.0000 mg | ORAL_CAPSULE | Freq: Every day | ORAL | 3 refills | Status: AC
  Filled 2023-12-26 – 2024-01-11 (×3): qty 90, 90d supply, fill #0
  Filled 2024-04-14: qty 90, 90d supply, fill #1
  Filled 2024-07-15: qty 90, 90d supply, fill #2

## 2024-01-04 ENCOUNTER — Other Ambulatory Visit (HOSPITAL_COMMUNITY): Payer: Self-pay

## 2024-01-04 ENCOUNTER — Other Ambulatory Visit: Payer: Self-pay

## 2024-01-05 ENCOUNTER — Other Ambulatory Visit: Payer: Self-pay

## 2024-01-05 ENCOUNTER — Other Ambulatory Visit (HOSPITAL_COMMUNITY): Payer: Self-pay

## 2024-01-05 MED ORDER — LOSARTAN POTASSIUM 100 MG PO TABS
100.0000 mg | ORAL_TABLET | Freq: Every day | ORAL | 1 refills | Status: DC
  Filled 2024-01-05: qty 90, 90d supply, fill #0
  Filled 2024-03-30: qty 90, 90d supply, fill #1

## 2024-01-05 MED ORDER — METOPROLOL TARTRATE 25 MG PO TABS
25.0000 mg | ORAL_TABLET | Freq: Two times a day (BID) | ORAL | 3 refills | Status: AC
  Filled 2024-01-05: qty 180, 90d supply, fill #0
  Filled 2024-04-14: qty 180, 90d supply, fill #1
  Filled 2024-07-17: qty 180, 90d supply, fill #2

## 2024-01-05 MED ORDER — BD LUER-LOK SYRINGE 23G X 1-1/2" 3 ML MISC
0 refills | Status: AC
  Filled 2024-01-05: qty 6, 84d supply, fill #0

## 2024-01-08 ENCOUNTER — Other Ambulatory Visit: Payer: Self-pay

## 2024-01-11 ENCOUNTER — Other Ambulatory Visit (HOSPITAL_COMMUNITY): Payer: Self-pay

## 2024-01-12 ENCOUNTER — Other Ambulatory Visit (HOSPITAL_COMMUNITY): Payer: Self-pay

## 2024-01-12 ENCOUNTER — Other Ambulatory Visit: Payer: Self-pay

## 2024-01-13 ENCOUNTER — Other Ambulatory Visit (HOSPITAL_COMMUNITY): Payer: Self-pay

## 2024-01-16 ENCOUNTER — Other Ambulatory Visit (HOSPITAL_COMMUNITY): Payer: Self-pay

## 2024-01-16 MED ORDER — FENOFIBRATE 160 MG PO TABS
160.0000 mg | ORAL_TABLET | Freq: Every day | ORAL | 1 refills | Status: DC
  Filled 2024-01-16: qty 90, 90d supply, fill #0
  Filled 2024-03-28 – 2024-04-14 (×2): qty 90, 90d supply, fill #1

## 2024-02-08 ENCOUNTER — Other Ambulatory Visit (HOSPITAL_COMMUNITY): Payer: Self-pay

## 2024-02-09 ENCOUNTER — Other Ambulatory Visit: Payer: Self-pay

## 2024-02-09 ENCOUNTER — Other Ambulatory Visit (HOSPITAL_COMMUNITY): Payer: Self-pay

## 2024-02-09 MED ORDER — TESTOSTERONE CYPIONATE 200 MG/ML IM SOLN
200.0000 mg | INTRAMUSCULAR | 1 refills | Status: AC
  Filled 2024-02-09: qty 10, 30d supply, fill #0
  Filled 2024-02-09: qty 6, 84d supply, fill #0
  Filled 2024-04-27: qty 10, 35d supply, fill #1
  Filled 2024-05-12: qty 6, 84d supply, fill #1
  Filled 2024-05-13: qty 6, 84d supply, fill #0
  Filled 2024-05-13: qty 10, 35d supply, fill #0

## 2024-02-27 ENCOUNTER — Other Ambulatory Visit (HOSPITAL_COMMUNITY): Payer: Self-pay

## 2024-02-28 ENCOUNTER — Other Ambulatory Visit (HOSPITAL_COMMUNITY): Payer: Self-pay

## 2024-02-28 MED ORDER — RYBELSUS 14 MG PO TABS
14.0000 mg | ORAL_TABLET | Freq: Every day | ORAL | 1 refills | Status: DC
  Filled 2024-02-28 – 2024-03-02 (×3): qty 90, 90d supply, fill #0
  Filled 2024-05-30: qty 90, 90d supply, fill #1

## 2024-03-01 ENCOUNTER — Other Ambulatory Visit (HOSPITAL_COMMUNITY): Payer: Self-pay

## 2024-03-02 ENCOUNTER — Other Ambulatory Visit (HOSPITAL_COMMUNITY): Payer: Self-pay

## 2024-03-04 ENCOUNTER — Encounter (HOSPITAL_COMMUNITY): Payer: Self-pay

## 2024-03-04 ENCOUNTER — Other Ambulatory Visit (HOSPITAL_COMMUNITY): Payer: Self-pay

## 2024-03-04 ENCOUNTER — Other Ambulatory Visit: Payer: Self-pay

## 2024-03-05 ENCOUNTER — Other Ambulatory Visit: Payer: Self-pay

## 2024-03-06 ENCOUNTER — Other Ambulatory Visit (HOSPITAL_COMMUNITY): Payer: Self-pay

## 2024-03-27 ENCOUNTER — Other Ambulatory Visit: Payer: Self-pay

## 2024-03-27 ENCOUNTER — Other Ambulatory Visit (HOSPITAL_COMMUNITY): Payer: Self-pay

## 2024-03-27 MED ORDER — ROSUVASTATIN CALCIUM 40 MG PO TABS
40.0000 mg | ORAL_TABLET | Freq: Every day | ORAL | 3 refills | Status: AC
  Filled 2024-03-27: qty 90, 90d supply, fill #0
  Filled 2024-07-06: qty 90, 90d supply, fill #1

## 2024-03-28 ENCOUNTER — Other Ambulatory Visit (HOSPITAL_COMMUNITY): Payer: Self-pay

## 2024-03-28 ENCOUNTER — Other Ambulatory Visit: Payer: Self-pay

## 2024-03-30 ENCOUNTER — Other Ambulatory Visit (HOSPITAL_COMMUNITY): Payer: Self-pay

## 2024-03-31 ENCOUNTER — Other Ambulatory Visit (HOSPITAL_COMMUNITY): Payer: Self-pay

## 2024-04-01 ENCOUNTER — Other Ambulatory Visit (HOSPITAL_COMMUNITY): Payer: Self-pay

## 2024-04-01 MED ORDER — EASY TOUCH HYPODERMIC NEEDLE 18G X 1-1/2" MISC
1 refills | Status: AC
  Filled 2024-04-01: qty 6, 84d supply, fill #0

## 2024-04-01 MED ORDER — BD LUER-LOK SYRINGE 23G X 1" 3 ML MISC
0 refills | Status: DC
  Filled 2024-04-01: qty 6, 42d supply, fill #0

## 2024-04-02 ENCOUNTER — Other Ambulatory Visit (HOSPITAL_COMMUNITY): Payer: Self-pay

## 2024-04-02 ENCOUNTER — Other Ambulatory Visit: Payer: Self-pay

## 2024-04-02 DIAGNOSIS — I10 Essential (primary) hypertension: Secondary | ICD-10-CM | POA: Diagnosis not present

## 2024-04-02 DIAGNOSIS — E349 Endocrine disorder, unspecified: Secondary | ICD-10-CM | POA: Diagnosis not present

## 2024-04-02 DIAGNOSIS — E7849 Other hyperlipidemia: Secondary | ICD-10-CM | POA: Diagnosis not present

## 2024-04-02 DIAGNOSIS — E1165 Type 2 diabetes mellitus with hyperglycemia: Secondary | ICD-10-CM | POA: Diagnosis not present

## 2024-04-09 DIAGNOSIS — D1802 Hemangioma of intracranial structures: Secondary | ICD-10-CM | POA: Diagnosis not present

## 2024-04-09 DIAGNOSIS — Z125 Encounter for screening for malignant neoplasm of prostate: Secondary | ICD-10-CM | POA: Diagnosis not present

## 2024-04-09 DIAGNOSIS — G4733 Obstructive sleep apnea (adult) (pediatric): Secondary | ICD-10-CM | POA: Diagnosis not present

## 2024-04-09 DIAGNOSIS — R1011 Right upper quadrant pain: Secondary | ICD-10-CM | POA: Diagnosis not present

## 2024-04-09 DIAGNOSIS — E1165 Type 2 diabetes mellitus with hyperglycemia: Secondary | ICD-10-CM | POA: Diagnosis not present

## 2024-04-09 DIAGNOSIS — K219 Gastro-esophageal reflux disease without esophagitis: Secondary | ICD-10-CM | POA: Diagnosis not present

## 2024-04-09 DIAGNOSIS — E349 Endocrine disorder, unspecified: Secondary | ICD-10-CM | POA: Diagnosis not present

## 2024-04-09 DIAGNOSIS — E7849 Other hyperlipidemia: Secondary | ICD-10-CM | POA: Diagnosis not present

## 2024-04-09 DIAGNOSIS — I10 Essential (primary) hypertension: Secondary | ICD-10-CM | POA: Diagnosis not present

## 2024-04-09 DIAGNOSIS — F418 Other specified anxiety disorders: Secondary | ICD-10-CM | POA: Diagnosis not present

## 2024-04-14 ENCOUNTER — Other Ambulatory Visit (HOSPITAL_COMMUNITY): Payer: Self-pay

## 2024-04-15 ENCOUNTER — Other Ambulatory Visit (HOSPITAL_COMMUNITY): Payer: Self-pay

## 2024-04-15 ENCOUNTER — Other Ambulatory Visit: Payer: Self-pay

## 2024-04-15 MED ORDER — CLONAZEPAM 0.5 MG PO TABS
0.5000 mg | ORAL_TABLET | Freq: Two times a day (BID) | ORAL | 1 refills | Status: AC | PRN
  Filled 2024-04-15: qty 180, 90d supply, fill #0
  Filled 2024-07-15: qty 180, 90d supply, fill #1

## 2024-04-28 ENCOUNTER — Other Ambulatory Visit (HOSPITAL_COMMUNITY): Payer: Self-pay

## 2024-05-01 ENCOUNTER — Other Ambulatory Visit: Payer: Self-pay

## 2024-05-04 ENCOUNTER — Other Ambulatory Visit (HOSPITAL_COMMUNITY): Payer: Self-pay

## 2024-05-08 ENCOUNTER — Other Ambulatory Visit: Payer: Self-pay

## 2024-05-08 ENCOUNTER — Other Ambulatory Visit (HOSPITAL_COMMUNITY): Payer: Self-pay

## 2024-05-08 MED ORDER — JARDIANCE 25 MG PO TABS
25.0000 mg | ORAL_TABLET | Freq: Every day | ORAL | 3 refills | Status: AC
  Filled 2024-05-08 – 2024-08-07 (×2): qty 90, 90d supply, fill #0
  Filled 2024-08-07: qty 90, 90d supply, fill #1

## 2024-05-13 ENCOUNTER — Other Ambulatory Visit: Payer: Self-pay

## 2024-05-13 ENCOUNTER — Other Ambulatory Visit (HOSPITAL_COMMUNITY): Payer: Self-pay

## 2024-05-14 ENCOUNTER — Other Ambulatory Visit (HOSPITAL_COMMUNITY): Payer: Self-pay

## 2024-05-14 ENCOUNTER — Other Ambulatory Visit: Payer: Self-pay

## 2024-05-14 ENCOUNTER — Ambulatory Visit: Payer: Self-pay | Admitting: Urology

## 2024-05-14 VITALS — BP 117/77 | HR 76 | Wt 216.0 lb

## 2024-05-14 DIAGNOSIS — R399 Unspecified symptoms and signs involving the genitourinary system: Secondary | ICD-10-CM | POA: Diagnosis not present

## 2024-05-14 DIAGNOSIS — Z125 Encounter for screening for malignant neoplasm of prostate: Secondary | ICD-10-CM

## 2024-05-14 DIAGNOSIS — N529 Male erectile dysfunction, unspecified: Secondary | ICD-10-CM

## 2024-05-14 LAB — BLADDER SCAN AMB NON-IMAGING

## 2024-05-14 MED ORDER — TADALAFIL 20 MG PO TABS
20.0000 mg | ORAL_TABLET | Freq: Every day | ORAL | 11 refills | Status: AC | PRN
  Filled 2024-05-14: qty 30, 30d supply, fill #0

## 2024-05-14 MED ORDER — BD LUER-LOK SYRINGE 23G X 1" 3 ML MISC: 0 refills | Status: AC

## 2024-05-14 MED ORDER — TAMSULOSIN HCL 0.4 MG PO CAPS
0.4000 mg | ORAL_CAPSULE | Freq: Every day | ORAL | 3 refills | Status: AC
  Filled 2024-05-14: qty 90, 90d supply, fill #0
  Filled 2024-08-07: qty 90, 90d supply, fill #1

## 2024-05-14 NOTE — Progress Notes (Signed)
   05/14/2024 8:23 AM   Dustin Perkins, Dustin Perkins [id number]  Reason for visit: Follow up ED, urinary symptoms, hypogonadism, PSA screening  History: Has mood disorder that improved significantly on Cymbalta , does not do well with stopping Cymbalta  or trying other medications or lower dose, however Cymbalta  causes ED for him Sildenafil  has not worked, only moderate improvement with Cialis  on demand.  He thinks he had tried Cialis  daily previously a few years ago. Urinary symptoms well-controlled on Flomax  0.4 mg nightly. On testosterone  injections through PCP PSAs always been normal, PVRs have been normal  Physical Exam: BP 117/77 (BP Location: Left Arm, Patient Position: Sitting, Cuff Size: Large)   Pulse 76   Wt 216 lb (98 kg)   SpO2 98%   BMI 30.55 kg/m   Imaging/labs: Reviewed, September 2025 labs notable for hemoglobin A1c 6.5, benign urinalysis aside from glucosuria, normal PSA of 0.52, normal renal function creatinine 1.3 GFR greater than 60  Today: PVR normal at 20ml No significant urinary symptoms on the Flomax  Continues to have problems with ED despite the max dose Cialis .  Multifactorial with Cymbalta  use.  He is not interested in penile injections  Plan:   Hypogonadism: Continue replacement through PCP PSA screening: PSA normal, can continue yearly monitoring on TRT through PCP BPH/LUTS: Continue Flomax , working well, no complaints ED: Secondary to Cymbalta  use.  I recommended trying Cialis  10 mg daily with a 10 mg boost dose as needed Flomax  and Cialis  refilled, RTC 1 year PVR   Dustin JAYSON Burnet, MD  [facility] [address] [place], [place] [address] [phone]

## 2024-05-14 NOTE — Patient Instructions (Addendum)
 The max dose of Cialis  daily is 20mg . You can try taking 10 mg daily Cialis  with an additional 10 mg boost dose as needed 1 hour prior to sexual activity   Understanding Erectile Dysfunction (ED)  What is ED? Erectile Dysfunction, or ED, is when a man has trouble getting or keeping an erection enough for sex. It is common and can happen at any age but is more common as men get older.  What Causes ED? ED can happen for many reasons, including:    Stress or feeling anxious Health problems like diabetes, heart disease, or high blood pressure Lifestyle habits like smoking, drinking too much alcohol, drug use, or not enough exercise Certain medicines(some blood pressure medications, antidepressants, sedatives)  Symptoms of ED:   Difficulty getting an erection Trouble keeping an erection during sex Reduced interest in sex  Treatment Options There are different ways to treat ED. Talk to your doctor to find what's best for you.  Lifestyle Changes   Exercise regularly Eat healthy foods Quit smoking and limit alcohol Weight loss Reduce stress  Medications   Oral medicines like Viagra (sildenafil ) or Cialis (tadalafil ).  These are generic and affordable, the lowest price is at [email or web address]com.  These must be taken about an hour before sexual activity, and still require sexual stimulation to get an erection Side effects can include stuffy nose, headache, muscle pain, or changes in vision There is no risk of heart attack or stroke when medications are taken as directed These medications cannot be taken with nitrates for chest pain  Other Treatments    Penile injections-a medicine is injected directly into the penis to give you an immediate erection Devices like pump vacuum systems that help create an erection Surgery: Penile prosthesis for patients with no improvement with medicines or injections who are still interested in sexual activity.  Visit [email or web address] for more  details.  Dr. Lovie is a urologist in [facility] with [facility] who performs penile prosthesis surgeries Counseling or Therapy    If ED is caused by stress, anxiety, or relationship issues, talking to a counselor or therapist can help.

## 2024-05-24 ENCOUNTER — Other Ambulatory Visit: Payer: Self-pay

## 2024-05-24 ENCOUNTER — Other Ambulatory Visit (HOSPITAL_COMMUNITY): Payer: Self-pay

## 2024-05-24 MED ORDER — TADALAFIL 10 MG PO TABS
10.0000 mg | ORAL_TABLET | Freq: Every day | ORAL | 3 refills | Status: AC | PRN
  Filled 2024-05-24: qty 30, 30d supply, fill #0

## 2024-05-30 ENCOUNTER — Other Ambulatory Visit (HOSPITAL_COMMUNITY): Payer: Self-pay

## 2024-06-26 ENCOUNTER — Other Ambulatory Visit (HOSPITAL_COMMUNITY): Payer: Self-pay

## 2024-06-27 ENCOUNTER — Other Ambulatory Visit: Payer: Self-pay

## 2024-06-27 ENCOUNTER — Other Ambulatory Visit (HOSPITAL_COMMUNITY): Payer: Self-pay

## 2024-06-27 MED ORDER — GLIPIZIDE ER 10 MG PO TB24
20.0000 mg | ORAL_TABLET | Freq: Every day | ORAL | 1 refills | Status: AC
  Filled 2024-06-27: qty 180, 90d supply, fill #0

## 2024-06-27 MED ORDER — LOSARTAN POTASSIUM 100 MG PO TABS
100.0000 mg | ORAL_TABLET | Freq: Every day | ORAL | 1 refills | Status: AC
  Filled 2024-06-27: qty 90, 90d supply, fill #0

## 2024-07-07 ENCOUNTER — Other Ambulatory Visit (HOSPITAL_COMMUNITY): Payer: Self-pay

## 2024-07-08 ENCOUNTER — Other Ambulatory Visit (HOSPITAL_COMMUNITY): Payer: Self-pay

## 2024-07-08 ENCOUNTER — Other Ambulatory Visit: Payer: Self-pay

## 2024-07-08 MED ORDER — FENOFIBRATE 160 MG PO TABS
160.0000 mg | ORAL_TABLET | Freq: Every day | ORAL | 1 refills | Status: AC
  Filled 2024-07-08: qty 90, 90d supply, fill #0

## 2024-07-15 ENCOUNTER — Other Ambulatory Visit (HOSPITAL_COMMUNITY): Payer: Self-pay

## 2024-07-18 ENCOUNTER — Other Ambulatory Visit (HOSPITAL_COMMUNITY): Payer: Self-pay

## 2024-07-31 ENCOUNTER — Other Ambulatory Visit (HOSPITAL_COMMUNITY): Payer: Self-pay

## 2024-08-02 ENCOUNTER — Other Ambulatory Visit: Payer: Self-pay

## 2024-08-02 ENCOUNTER — Other Ambulatory Visit (HOSPITAL_COMMUNITY): Payer: Self-pay

## 2024-08-02 MED ORDER — ESOMEPRAZOLE MAGNESIUM 40 MG PO CPDR
40.0000 mg | DELAYED_RELEASE_CAPSULE | Freq: Two times a day (BID) | ORAL | 1 refills | Status: AC
  Filled 2024-08-02: qty 180, 90d supply, fill #0

## 2024-08-07 ENCOUNTER — Other Ambulatory Visit: Payer: Self-pay

## 2024-08-07 ENCOUNTER — Other Ambulatory Visit (HOSPITAL_BASED_OUTPATIENT_CLINIC_OR_DEPARTMENT_OTHER): Payer: Self-pay

## 2024-08-07 ENCOUNTER — Other Ambulatory Visit (HOSPITAL_COMMUNITY): Payer: Self-pay

## 2024-08-22 ENCOUNTER — Other Ambulatory Visit: Payer: Self-pay

## 2024-08-25 ENCOUNTER — Other Ambulatory Visit (HOSPITAL_COMMUNITY): Payer: Self-pay

## 2024-08-26 ENCOUNTER — Other Ambulatory Visit: Payer: Self-pay

## 2024-08-27 ENCOUNTER — Other Ambulatory Visit (HOSPITAL_COMMUNITY): Payer: Self-pay

## 2024-08-27 MED ORDER — RYBELSUS 14 MG PO TABS
ORAL_TABLET | ORAL | 1 refills | Status: AC
  Filled 2024-08-27: qty 90, 90d supply, fill #0
  Filled 2024-08-30: qty 90, fill #0

## 2024-08-28 ENCOUNTER — Other Ambulatory Visit: Payer: Self-pay

## 2024-08-28 ENCOUNTER — Other Ambulatory Visit (HOSPITAL_COMMUNITY): Payer: Self-pay

## 2025-05-15 ENCOUNTER — Ambulatory Visit: Admitting: Urology
